# Patient Record
Sex: Male | Born: 1945 | Race: Black or African American | Hispanic: No | Marital: Single | Smoking: Current every day smoker
Health system: Southern US, Community
[De-identification: ages and names within clinical notes are randomized; demographics above are authoritative.]

## PROBLEM LIST (undated history)

## (undated) DIAGNOSIS — F4310 Post-traumatic stress disorder, unspecified: Secondary | ICD-10-CM

---

## 2016-07-01 DIAGNOSIS — F4310 Post-traumatic stress disorder, unspecified: Secondary | ICD-10-CM | POA: Insufficient documentation

## 2016-07-01 DIAGNOSIS — F1420 Cocaine dependence, uncomplicated: Secondary | ICD-10-CM | POA: Insufficient documentation

## 2017-08-13 DIAGNOSIS — Z765 Malingerer [conscious simulation]: Secondary | ICD-10-CM | POA: Insufficient documentation

## 2018-06-28 ENCOUNTER — Emergency Department (HOSPITAL_COMMUNITY): Payer: Medicaid Other

## 2018-06-28 ENCOUNTER — Encounter (HOSPITAL_COMMUNITY): Payer: Self-pay | Admitting: Emergency Medicine

## 2018-06-28 ENCOUNTER — Emergency Department (HOSPITAL_COMMUNITY)
Admission: EM | Admit: 2018-06-28 | Discharge: 2018-06-28 | Disposition: A | Discharge status: Home / Self Care | Payer: Medicaid Other | Attending: Emergency Medicine | Admitting: Emergency Medicine

## 2018-06-28 DIAGNOSIS — Y999 Unspecified external cause status: Secondary | ICD-10-CM | POA: Insufficient documentation

## 2018-06-28 DIAGNOSIS — J449 Chronic obstructive pulmonary disease, unspecified: Secondary | ICD-10-CM | POA: Insufficient documentation

## 2018-06-28 DIAGNOSIS — Y929 Unspecified place or not applicable: Secondary | ICD-10-CM | POA: Diagnosis not present

## 2018-06-28 DIAGNOSIS — W010XXA Fall on same level from slipping, tripping and stumbling without subsequent striking against object, initial encounter: Secondary | ICD-10-CM | POA: Diagnosis not present

## 2018-06-28 DIAGNOSIS — F17200 Nicotine dependence, unspecified, uncomplicated: Secondary | ICD-10-CM | POA: Insufficient documentation

## 2018-06-28 DIAGNOSIS — S99911A Unspecified injury of right ankle, initial encounter: Secondary | ICD-10-CM | POA: Diagnosis present

## 2018-06-28 DIAGNOSIS — Y939 Activity, unspecified: Secondary | ICD-10-CM | POA: Diagnosis not present

## 2018-06-28 DIAGNOSIS — S82851A Displaced trimalleolar fracture of right lower leg, initial encounter for closed fracture: Secondary | ICD-10-CM | POA: Diagnosis not present

## 2018-06-28 HISTORY — DX: Post-traumatic stress disorder, unspecified: F43.10

## 2018-06-28 LAB — CBC WITH DIFFERENTIAL/PLATELET
Abs Immature Granulocytes: 0 10*3/uL (ref 0.0–0.1)
BASOS ABS: 0 10*3/uL (ref 0.0–0.1)
Basophils Relative: 0 %
EOS PCT: 1 %
Eosinophils Absolute: 0.1 10*3/uL (ref 0.0–0.7)
HEMATOCRIT: 43.1 % (ref 39.0–52.0)
Hemoglobin: 13.7 g/dL (ref 13.0–17.0)
Immature Granulocytes: 0 %
Lymphocytes Relative: 26 %
Lymphs Abs: 2.3 10*3/uL (ref 0.7–4.0)
MCH: 27.3 pg (ref 26.0–34.0)
MCHC: 31.8 g/dL (ref 30.0–36.0)
MCV: 85.9 fL (ref 78.0–100.0)
MONO ABS: 0.7 10*3/uL (ref 0.1–1.0)
Monocytes Relative: 7 %
Neutro Abs: 5.9 10*3/uL (ref 1.7–7.7)
Neutrophils Relative %: 66 %
Platelets: 234 10*3/uL (ref 150–400)
RBC: 5.02 MIL/uL (ref 4.22–5.81)
RDW: 16.7 % — ABNORMAL HIGH (ref 11.5–15.5)
WBC: 9 10*3/uL (ref 4.0–10.5)

## 2018-06-28 LAB — BASIC METABOLIC PANEL
ANION GAP: 12 (ref 5–15)
BUN: 14 mg/dL (ref 8–23)
CHLORIDE: 108 mmol/L (ref 98–111)
CO2: 20 mmol/L — ABNORMAL LOW (ref 22–32)
Calcium: 9.4 mg/dL (ref 8.9–10.3)
Creatinine, Ser: 1.01 mg/dL (ref 0.61–1.24)
Glucose, Bld: 107 mg/dL — ABNORMAL HIGH (ref 70–99)
POTASSIUM: 4.3 mmol/L (ref 3.5–5.1)
SODIUM: 140 mmol/L (ref 135–145)

## 2018-06-28 MED ORDER — OXYCODONE-ACETAMINOPHEN 5-325 MG PO TABS: 1.0000 | ORAL_TABLET | Freq: Once | ORAL | Status: DC

## 2018-06-28 MED ORDER — OXYCODONE-ACETAMINOPHEN 5-325 MG PO TABS: 1.0000 | ORAL_TABLET | ORAL | 0 refills | Status: DC | PRN

## 2018-06-28 MED ORDER — OXYCODONE-ACETAMINOPHEN 5-325 MG PO TABS
1.0000 | ORAL_TABLET | Freq: Once | ORAL | Status: AC
  Administered 2018-06-28: 1 via ORAL
  Filled 2018-06-28: qty 1

## 2018-06-28 MED ORDER — FENTANYL CITRATE (PF) 100 MCG/2ML IJ SOLN
50.0000 ug | Freq: Once | INTRAMUSCULAR | Status: AC
  Administered 2018-06-28: 50 ug via INTRAVENOUS
  Filled 2018-06-28: qty 2

## 2018-06-28 NOTE — ED Provider Notes (Signed)
[name] Provider Note   CSN: [id number] Arrival date & time: 06/28/18  0005   History   Chief Complaint Chief Complaint  Patient presents with  . Ankle Injury    HPI Christopher Short is a 72 y.o. male who presents right ankle injury. PMH significant for PTSD, COPD, hx of cocaine use. He states that he stepped in a hole yesterday and injured his ankle and fell. He states he fell hard but denies any other injuries. The pain is severe, constant, throbbing. Nothing makes it better. He has been walking on it. He reports just being [facility] for [facility]. He lives alone and ambulates with a cane.  HPI  Past Medical History:  Diagnosis Date  . PTSD (post-traumatic stress disorder)     There are no active problems to display for this patient.   History reviewed. No pertinent surgical history.      Home Medications    Prior to Admission medications   Not on File    Family History No family history on file.  Social History Social History   Tobacco Use  . Smoking status: Current Every Day Smoker  . Smokeless tobacco: Never Used  Substance Use Topics  . Alcohol use: Never    Frequency: Never  . Drug use: Never     Allergies   Patient has no known allergies.   Review of Systems Review of Systems  Musculoskeletal: Positive for arthralgias and joint swelling.  Skin: Negative for wound.     Physical Exam Updated Vital Signs BP (!) 127/101 (BP Location: Right Arm)   Pulse (!) 104   Temp 98.5 F (36.9 C) (Oral)   Resp 18   Ht 6\' 4"  (1.93 m)   Wt 106.6 kg (235 lb)   SpO2 96%   BMI 28.61 kg/m   Physical Exam  Constitutional: He is oriented to person, place, and time. He appears well-developed and well-nourished. No distress.  HENT:  Head: Normocephalic and atraumatic.  Eyes: Pupils are equal, round, and reactive to light. Conjunctivae are normal. Right eye exhibits no discharge. Left eye exhibits no  discharge. No scleral icterus.  Neck: Normal range of motion.  Cardiovascular: Normal rate.  Pulmonary/Chest: Effort normal. No respiratory distress.  Abdominal: He exhibits no distension.  Musculoskeletal:  Right ankle: Diffuse ankle and foot swelling. No open wounds. Diffuse tenderness to palpation. N/V intact.   Neurological: He is alert and oriented to person, place, and time.  Skin: Skin is warm and dry.  Psychiatric: He has a normal mood and affect. His behavior is normal.  Nursing note and vitals reviewed.    ED Treatments / Results  Labs (all labs ordered are listed, but only abnormal results are displayed) Labs Reviewed  BASIC METABOLIC PANEL - Abnormal; Notable for the following components:      Result Value   CO2 20 (*)    Glucose, Bld 107 (*)    All other components within normal limits  CBC WITH DIFFERENTIAL/PLATELET - Abnormal; Notable for the following components:   RDW 16.7 (*)    All other components within normal limits    EKG None  Radiology Dg Ankle Complete Right  Result Date: 06/28/2018 CLINICAL DATA:  Right ankle pain and swelling after injury. Stepped in a hole this evening. EXAM: RIGHT ANKLE - COMPLETE 3+ VIEW COMPARISON:  None. FINDINGS: Displaced trimalleolar fracture. Oblique fracture of the distal fibula just proximal to the ankle mortise is mildly displaced.  Transverse medial malleolar fracture is displaced. Distal tibial and medial malleolar fracture fragments remain aligned with the talus with mild talar tilt and posterior subluxation of the talar dome with respect to the tibial plafond. Small posterior tibial tubercle fracture is displaced 4 mm, with only a small displaced fragment. Diffuse soft tissue edema about the ankle, possible scattered soft tissue air. IMPRESSION: 1. Displaced trimalleolar fracture with mild talar tilt and subluxation. 2. Diffuse soft tissue edema. Probable scattered soft tissue air, raising concern for open fracture or  associated laceration. Electronically Signed   By: Rubye Oaks M.D.   On: 06/28/2018 02:08   Dg Knee Right Port  Result Date: 06/28/2018 CLINICAL DATA:  Right knee pain. EXAM: PORTABLE RIGHT KNEE - 1-2 VIEW COMPARISON:  None. FINDINGS: No fracture or dislocation. Quadriceps tendon enthesophyte. Minimal patellofemoral spurring. Small joint effusion. Mild anterior soft tissue edema. IMPRESSION: 1. Minimal osteoarthritis. No fracture or acute osseous abnormality. 2. Small joint effusion and soft tissue edema. Electronically Signed   By: Rubye Oaks M.D.   On: 06/28/2018 06:59    Procedures Procedures (including critical care time)  Medications Ordered in ED Medications  oxyCODONE-acetaminophen (PERCOCET/ROXICET) 5-325 MG per tablet 1 tablet (has no administration in time range)  fentaNYL (SUBLIMAZE) injection 50 mcg (50 mcg Intravenous Given 06/28/18 0737)     Initial Impression / Assessment and Plan / ED Course  I have reviewed the triage vital signs and the nursing notes.  Pertinent labs & imaging results that were available during my care of the patient were reviewed by me and considered in my medical decision making (see chart for details).  72 year old male presents with right ankle injury after stepping in a hole last night. Xray shows displaced trimalleolar fracture. On exam he has diffuse swelling. Skin is intact and he is N/V intact. Shared visit with Dr. Bebe Shaggy. Discussed with Dr. Jena Gauss with orthopedics who recommends splint and f/u in the office Tuesday if pain is controlled and he can mobilize. If not, will ask hospitalist to admit for possible surgery this weekend.   11:28 AM Splint was applied. Pain has been controlled. He was able to ambulate however there are concerns from orthotech regarding safety. Discussed admission with patient and he declines. He would prefer f/u on Tuesday with ortho. Will place CM consult so they can possibly get him a walker or another  assistive device that would be easier to use. The patient is agreeable to this. Will d/c.   Final Clinical Impressions(s) / ED Diagnoses   Final diagnoses:  Closed trimalleolar fracture of right ankle, initial encounter    ED Discharge Orders    None       Bethel Born, PA-C 06/28/18 1300    Zadie Rhine, MD 06/29/18 [phone]

## 2018-06-28 NOTE — Care Management Note (Signed)
Case Management Note  Patient Details  Name: Christopher Stallsndrew Isaacs Jr. MRN: [id number] Date of Birth: 1945-12-12  Subjective/Objective:      Pt presented for leg pain and fracture.  Pt eager to leave but seems to have little to no support and has been advised that staff questions his safety.  Pt is alert and oriented and insists on leaving. Pt reluctantly agrees to home health and has no preference of agency.       [facility]: [facility]  [facility].  Pt did not want to wait for a walker or W/C and elected to leave in a cab with crutches.   Expected Discharge Date:                  Expected Discharge Plan:  Home w Home Health Services  In-House Referral:  NA  Discharge planning Services  CM Consult  Post Acute Care Choice:  Durable Medical Equipment, Home Health Choice offered to:  Patient  DME Arranged:  Crutches DME Agency:  NA(arranged by Ortho services)  HH Arranged:  Nurse's Aide, PT HH Agency:  [facility]  Status of Service:  Completed, signed off  If discussed at Long Length of Stay Meetings, dates discussed:    Additional Comments:  [name]  [name], RN 06/28/2018, 12:51 PM

## 2018-06-28 NOTE — ED Notes (Signed)
Pt ambulating throughout ed with crutches.  No difficulty utilizing crutches.  States he does not want to wait for a wheelchair and walker.  States he has stairs at home.  PA aware.

## 2018-06-28 NOTE — ED Notes (Signed)
Ortho at bedside.  Ambulated pt with crutches.  Pt very unsteady on crutches.

## 2018-06-28 NOTE — ED Triage Notes (Signed)
Patient accidentally stepped on a hole this evening and injured his right ankle with pain and swelling , skin intact ,ambulatory using his cane .

## 2018-06-28 NOTE — Discharge Instructions (Addendum)
Call on Monday for an appointment on Tuesday with Dr. Jena GaussHaddix Take pain medicine as needed for severe pain Keep leg elevated to reduce swelling  Return if you are worsening

## 2018-06-28 NOTE — Progress Notes (Signed)
Orthopedic Tech Progress Note Patient Details:  Christopher Stallsndrew Puello Jr. [birth year] [id number]  Patient ID: Christopher StallsAndrew Trant Jr., male   DOB: [birth year], 72 y.o.   MRN: [id number]   Christopher Short, Christopher Short 06/28/2018, 1:22 PM Christopher Short called ortho and stated that Dr. Jefm BryantGekas wants pt to have crutches for home. Pt has receive crutch training and is able to ambulate with them.

## 2018-06-28 NOTE — ED Notes (Signed)
Ortho at bedside.

## 2018-06-28 NOTE — ED Notes (Addendum)
Ortho paged and reponded

## 2018-06-28 NOTE — Progress Notes (Signed)
Orthopedic Tech Progress Note Patient Details:  Christopher Stallsndrew Wadsworth Jr. 1946/04/11 [id number]  Patient ID: Christopher StallsAndrew Snedden Jr., male   DOB: 1946/04/11, 72 y.o.   MRN: [id number]   Nikki DomCrawford, Zhuri Krass 06/28/2018, 10:33 AM Delete one ortho tech visit

## 2018-06-28 NOTE — Progress Notes (Signed)
Orthopedic Tech Progress Note Patient Details:  Christopher Stallsndrew Fussell Jr. 10-Aug-1946 [id number] Pt unable to use crutches; RN notified Ortho Devices Type of Ortho Device: Ace wrap, Crutches, Post (short leg) splint, Stirrup splint Ortho Device/Splint Location: rle Ortho Device/Splint Interventions: Application   Post Interventions Patient Tolerated: Well Instructions Provided: Care of device   Nikki DomCrawford, Leilany Digeronimo 06/28/2018, 10:31 AM

## 2018-07-04 ENCOUNTER — Encounter (HOSPITAL_COMMUNITY): Payer: Self-pay | Admitting: *Deleted

## 2018-07-04 ENCOUNTER — Emergency Department (HOSPITAL_COMMUNITY)
Admission: EM | Admit: 2018-07-04 | Discharge: 2018-07-04 | Disposition: A | Discharge status: Home / Self Care | Payer: Medicaid Other | Attending: Emergency Medicine | Admitting: Emergency Medicine

## 2018-07-04 DIAGNOSIS — S82851D Displaced trimalleolar fracture of right lower leg, subsequent encounter for closed fracture with routine healing: Secondary | ICD-10-CM | POA: Insufficient documentation

## 2018-07-04 DIAGNOSIS — F17200 Nicotine dependence, unspecified, uncomplicated: Secondary | ICD-10-CM | POA: Diagnosis not present

## 2018-07-04 DIAGNOSIS — X509XXA Other and unspecified overexertion or strenuous movements or postures, initial encounter: Secondary | ICD-10-CM | POA: Insufficient documentation

## 2018-07-04 DIAGNOSIS — Y999 Unspecified external cause status: Secondary | ICD-10-CM | POA: Insufficient documentation

## 2018-07-04 DIAGNOSIS — S8991XA Unspecified injury of right lower leg, initial encounter: Secondary | ICD-10-CM | POA: Diagnosis present

## 2018-07-04 DIAGNOSIS — Y939 Activity, unspecified: Secondary | ICD-10-CM | POA: Diagnosis not present

## 2018-07-04 DIAGNOSIS — Y929 Unspecified place or not applicable: Secondary | ICD-10-CM | POA: Insufficient documentation

## 2018-07-04 MED ORDER — OXYCODONE-ACETAMINOPHEN 5-325 MG PO TABS: 1.0000 | ORAL_TABLET | ORAL | 0 refills | Status: DC | PRN

## 2018-07-04 MED ORDER — OXYCODONE-ACETAMINOPHEN 5-325 MG PO TABS
1.0000 | ORAL_TABLET | Freq: Once | ORAL | Status: AC
  Administered 2018-07-04: 1 via ORAL
  Filled 2018-07-04: qty 1
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
## 2018-07-04 NOTE — Progress Notes (Signed)
Orthopedic Tech Progress Note Patient Details:  Christopher Stallsndrew Schuelke Jr. 06/03/1946 [id number]  Ortho Devices Type of Ortho Device: Ace wrap, Post (short leg) splint, Stirrup splint Ortho Device/Splint Location: rle Ortho Device/Splint Interventions: Application   Post Interventions Patient Tolerated: Well Instructions Provided: Care of device   Nikki DomCrawford, Curtis Cain 07/04/2018, 2:36 PM

## 2018-07-04 NOTE — Discharge Instructions (Signed)
YOU MUST GO SEE ORTHOPEDICS SPECIALIST OR YOUR LEG WILL NOT HEAL. Keep it elevated. Ice. Do not walk on it. Call either physician provided or any other physician you choose, but you must see someone next week. Pain medications as needed.

## 2019-01-03 ENCOUNTER — Encounter (HOSPITAL_COMMUNITY): Payer: Self-pay

## 2019-01-03 ENCOUNTER — Other Ambulatory Visit: Payer: Self-pay

## 2019-01-03 ENCOUNTER — Emergency Department (HOSPITAL_COMMUNITY): Payer: Medicaid Other

## 2019-01-03 ENCOUNTER — Emergency Department (HOSPITAL_COMMUNITY)
Admission: EM | Admit: 2019-01-03 | Discharge: 2019-01-05 | Disposition: A | Discharge status: Home / Self Care | Payer: Medicaid Other | Attending: Emergency Medicine | Admitting: Emergency Medicine

## 2019-01-03 DIAGNOSIS — R45851 Suicidal ideations: Secondary | ICD-10-CM | POA: Insufficient documentation

## 2019-01-03 DIAGNOSIS — R911 Solitary pulmonary nodule: Secondary | ICD-10-CM | POA: Insufficient documentation

## 2019-01-03 DIAGNOSIS — F4310 Post-traumatic stress disorder, unspecified: Secondary | ICD-10-CM | POA: Insufficient documentation

## 2019-01-03 DIAGNOSIS — Z765 Malingerer [conscious simulation]: Secondary | ICD-10-CM | POA: Diagnosis not present

## 2019-01-03 DIAGNOSIS — F1410 Cocaine abuse, uncomplicated: Secondary | ICD-10-CM | POA: Insufficient documentation

## 2019-01-03 DIAGNOSIS — N3000 Acute cystitis without hematuria: Secondary | ICD-10-CM | POA: Diagnosis not present

## 2019-01-03 DIAGNOSIS — Z79899 Other long term (current) drug therapy: Secondary | ICD-10-CM | POA: Insufficient documentation

## 2019-01-03 DIAGNOSIS — F17210 Nicotine dependence, cigarettes, uncomplicated: Secondary | ICD-10-CM | POA: Insufficient documentation

## 2019-01-03 DIAGNOSIS — F332 Major depressive disorder, recurrent severe without psychotic features: Secondary | ICD-10-CM | POA: Diagnosis not present

## 2019-01-03 LAB — COMPREHENSIVE METABOLIC PANEL
ALT: 21 U/L (ref 0–44)
ANION GAP: 11 (ref 5–15)
AST: 24 U/L (ref 15–41)
Albumin: 3.6 g/dL (ref 3.5–5.0)
Alkaline Phosphatase: 51 U/L (ref 38–126)
BILIRUBIN TOTAL: 0.7 mg/dL (ref 0.3–1.2)
BUN: 7 mg/dL — ABNORMAL LOW (ref 8–23)
CALCIUM: 9.7 mg/dL (ref 8.9–10.3)
CO2: 25 mmol/L (ref 22–32)
Chloride: 105 mmol/L (ref 98–111)
Creatinine, Ser: 0.97 mg/dL (ref 0.61–1.24)
Glucose, Bld: 95 mg/dL (ref 70–99)
Potassium: 3.8 mmol/L (ref 3.5–5.1)
SODIUM: 141 mmol/L (ref 135–145)
TOTAL PROTEIN: 7.1 g/dL (ref 6.5–8.1)

## 2019-01-03 LAB — RAPID URINE DRUG SCREEN, HOSP PERFORMED
Amphetamines: NOT DETECTED
Barbiturates: NOT DETECTED
Benzodiazepines: NOT DETECTED
Cocaine: POSITIVE — AB
OPIATES: NOT DETECTED
Tetrahydrocannabinol: NOT DETECTED

## 2019-01-03 LAB — CBC
HCT: 45.1 % (ref 39.0–52.0)
Hemoglobin: 13.9 g/dL (ref 13.0–17.0)
MCH: 26.9 pg (ref 26.0–34.0)
MCHC: 30.8 g/dL (ref 30.0–36.0)
MCV: 87.4 fL (ref 80.0–100.0)
PLATELETS: 334 10*3/uL (ref 150–400)
RBC: 5.16 MIL/uL (ref 4.22–5.81)
RDW: 16.3 % — ABNORMAL HIGH (ref 11.5–15.5)
WBC: 6.9 10*3/uL (ref 4.0–10.5)
nRBC: 0 % (ref 0.0–0.2)

## 2019-01-03 LAB — URINALYSIS, ROUTINE W REFLEX MICROSCOPIC
Bilirubin Urine: NEGATIVE
Glucose, UA: NEGATIVE mg/dL
Ketones, ur: NEGATIVE mg/dL
Nitrite: NEGATIVE
PH: 5 (ref 5.0–8.0)
Protein, ur: NEGATIVE mg/dL
Specific Gravity, Urine: 1.019 (ref 1.005–1.030)

## 2019-01-03 LAB — SALICYLATE LEVEL

## 2019-01-03 LAB — ETHANOL: ALCOHOL ETHYL (B): 45 mg/dL — AB (ref <10)

## 2019-01-03 LAB — ACETAMINOPHEN LEVEL

## 2019-01-03 LAB — TSH: TSH: 0.571 u[IU]/mL (ref 0.350–4.500)

## 2019-01-03 MED ORDER — LORAZEPAM 2 MG/ML IJ SOLN: 0.0000 mg | Freq: Two times a day (BID) | INTRAMUSCULAR | Status: DC

## 2019-01-03 MED ORDER — NICOTINE 21 MG/24HR TD PT24
21.0000 mg | MEDICATED_PATCH | Freq: Every day | TRANSDERMAL | Status: DC
  Administered 2019-01-05: 21 mg via TRANSDERMAL
  Filled 2019-01-03: qty 1

## 2019-01-03 MED ORDER — LORAZEPAM 2 MG/ML IJ SOLN: 0.0000 mg | Freq: Four times a day (QID) | INTRAMUSCULAR | Status: DC

## 2019-01-03 MED ORDER — LORAZEPAM 1 MG PO TABS
0.0000 mg | ORAL_TABLET | Freq: Four times a day (QID) | ORAL | Status: DC
  Administered 2019-01-03: 2 mg via ORAL
  Filled 2019-01-03: qty 2

## 2019-01-03 MED ORDER — THIAMINE HCL 100 MG/ML IJ SOLN: 100.0000 mg | Freq: Every day | INTRAMUSCULAR | Status: DC

## 2019-01-03 MED ORDER — ONDANSETRON HCL 4 MG PO TABS: 4.0000 mg | ORAL_TABLET | Freq: Three times a day (TID) | ORAL | Status: DC | PRN

## 2019-01-03 MED ORDER — VITAMIN B-1 100 MG PO TABS
100.0000 mg | ORAL_TABLET | Freq: Every day | ORAL | Status: DC
  Administered 2019-01-03 – 2019-01-05 (×3): 100 mg via ORAL
  Filled 2019-01-03 (×3): qty 1

## 2019-01-03 MED ORDER — ALUM & MAG HYDROXIDE-SIMETH 200-200-20 MG/5ML PO SUSP: 30.0000 mL | Freq: Four times a day (QID) | ORAL | Status: DC | PRN

## 2019-01-03 MED ORDER — LORAZEPAM 1 MG PO TABS: 0.0000 mg | ORAL_TABLET | Freq: Two times a day (BID) | ORAL | Status: DC

## 2019-01-03 NOTE — ED Notes (Signed)
Christopher Short called from [facility]. Pt does meet inpatient criteria. Pt wants to go to [facility], but there are no beds. Pt aware and states understanding.

## 2019-01-03 NOTE — ED Notes (Signed)
Patient denies pain and is resting comfortably.  

## 2019-01-03 NOTE — ED Notes (Signed)
Pt wanded by security. 

## 2019-01-03 NOTE — ED Notes (Signed)
Called staffing for sitter 

## 2019-01-03 NOTE — ED Notes (Signed)
Given turkey sandwich and coke 
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
## 2019-01-03 NOTE — ED Triage Notes (Signed)
Pt with c/o depression and SI.  Pt states "I feel hopeless and depressed".

## 2019-01-03 NOTE — ED Provider Notes (Signed)
[name] EMERGENCY DEPARTMENT Provider Note   CSN: [id number] Arrival date & time: 01/03/19  1609     History   Chief Complaint Chief Complaint  Patient presents with  . Suicidal    HPI [facility]. is a 73 y.o. male.  Pt presents to the ED today with SI.  The pt has a hx of depression and has been hospitalized for SI (last Jan 2019).  The pt lives alone and feels hopeless.  He has no specific plan.     Past Medical History:  Diagnosis Date  . PTSD (post-traumatic stress disorder)     Patient Active Problem List   Diagnosis Date Noted  . Tobacco use disorder 01/06/2019  . Alcohol use disorder, moderate, dependence (HCC) 01/06/2019  . Severe recurrent major depressive disorder with psychotic features (HCC) 01/05/2019  . Malingering 08/13/2017  . Cocaine use disorder, moderate, dependence (HCC) 07/01/2016  . PTSD (post-traumatic stress disorder) 07/01/2016    History reviewed. No pertinent surgical history.      Home Medications    Prior to Admission medications   Medication Sig Start Date End Date Taking? Authorizing Provider  albuterol (PROVENTIL HFA;VENTOLIN HFA) 108 (90 Base) MCG/ACT inhaler Inhale 2 puffs into the lungs every 6 (six) hours as needed for wheezing or shortness of breath.   Yes [provider]  Cholecalciferol (VITAMIN D) 50 MCG (2000 UT) tablet Take 2,000 Units by mouth daily.   Yes [provider]  Melatonin 3 MG TABS Take 6 mg by mouth at bedtime.   Yes [provider]  Multiple Vitamin (MULTIVITAMIN WITH MINERALS) TABS tablet Take 1 tablet by mouth daily.   Yes [provider]  prazosin (MINIPRESS) 1 MG capsule Take 1 mg by mouth at bedtime.   Yes [provider]  QUEtiapine (SEROQUEL) 300 MG tablet Take 600 mg by mouth at bedtime.   Yes [provider]  Skin Protectants, Misc. (EUCERIN) cream Apply 1 application topically daily as needed for dry skin (apply to  feet).   Yes [provider]  talc powder Apply 1 application topically daily as needed (itching/discomfort on feet).   Yes [provider]  tiotropium (SPIRIVA) 18 MCG inhalation capsule Place 18 mcg into inhaler and inhale daily.   Yes [provider]  traZODone (DESYREL) 150 MG tablet Take 300 mg by mouth at bedtime.   Yes [provider]  cephALEXin (KEFLEX) 500 MG capsule Take 2 capsules (1,000 mg total) by mouth 2 (two) times daily. 01/05/19   Arby Barrette, MD  oxyCODONE-acetaminophen (PERCOCET) 5-325 MG tablet Take 1 tablet by mouth every 4 (four) hours as needed for severe pain. Patient not taking: Reported on 01/03/2019 07/04/18   Jaynie Crumble, PA-C    Family History History reviewed. No pertinent family history.  Social History Social History   Tobacco Use  . Smoking status: Current Every Day Smoker    Packs/day: 1.00    Types: Cigarettes  . Smokeless tobacco: Never Used  Substance Use Topics  . Alcohol use: Yes    Frequency: Never    Comment: last drank 01-03-19 16 ox x 1 beer   . Drug use: Never     Allergies   Patient has no known allergies.   Review of Systems Review of Systems  Psychiatric/Behavioral: Positive for suicidal ideas.  All other systems reviewed and are negative.    Physical Exam Updated Vital Signs BP 118/72   Pulse 90   Temp 98.5 F (  36.9 C) (Oral)   Resp 18   SpO2 95%   Physical Exam Vitals signs and nursing note reviewed.  Constitutional:      Appearance: Normal appearance.  HENT:     Head: Normocephalic and atraumatic.     Right Ear: External ear normal.     Left Ear: External ear normal.     Nose: Nose normal.     Mouth/Throat:     Mouth: Mucous membranes are moist.  Eyes:     Extraocular Movements: Extraocular movements intact.     Conjunctiva/sclera: Conjunctivae normal.     Pupils: Pupils are equal, round, and reactive to light.  Neck:     Musculoskeletal: Normal range of motion  and neck supple.  Cardiovascular:     Rate and Rhythm: Normal rate and regular rhythm.     Pulses: Normal pulses.     Heart sounds: Normal heart sounds.  Pulmonary:     Effort: Pulmonary effort is normal.     Breath sounds: Normal breath sounds.  Abdominal:     General: Abdomen is flat.  Musculoskeletal: Normal range of motion.  Skin:    General: Skin is warm.     Capillary Refill: Capillary refill takes less than 2 seconds.  Neurological:     General: No focal deficit present.     Mental Status: He is alert and oriented to person, place, and time.  Psychiatric:        Thought Content: Thought content includes suicidal ideation.      ED Treatments / Results  Labs (all labs ordered are listed, but only abnormal results are displayed) Labs Reviewed  URINE CULTURE - Abnormal; Notable for the following components:      Result Value   Culture 50,000 COLONIES/mL VIRIDANS STREPTOCOCCUS (*)    All other components within normal limits  COMPREHENSIVE METABOLIC PANEL - Abnormal; Notable for the following components:   BUN 7 (*)    All other components within normal limits  ETHANOL - Abnormal; Notable for the following components:   Alcohol, Ethyl (B) 45 (*)    All other components within normal limits  ACETAMINOPHEN LEVEL - Abnormal; Notable for the following components:   Acetaminophen (Tylenol), Serum <10 (*)    All other components within normal limits  CBC - Abnormal; Notable for the following components:   RDW 16.3 (*)    All other components within normal limits  RAPID URINE DRUG SCREEN, HOSP PERFORMED - Abnormal; Notable for the following components:   Cocaine POSITIVE (*)    All other components within normal limits  URINALYSIS, ROUTINE W REFLEX MICROSCOPIC - Abnormal; Notable for the following components:   Hgb urine dipstick MODERATE (*)    Leukocytes, UA MODERATE (*)    Bacteria, UA RARE (*)    All other components within normal limits  HEMOGLOBIN A1C - Abnormal;  Notable for the following components:   Hgb A1c MFr Bld 6.0 (*)    All other components within normal limits  LIPID PANEL - Abnormal; Notable for the following components:   Triglycerides 333 (*)    VLDL 67 (*)    All other components within normal limits  SALICYLATE LEVEL  TSH    EKG EKG Interpretation  Date/Time:  Saturday January 03 2019 18:07:35 EST Ventricular Rate:  89 PR Interval:  146 QRS Duration: 84 QT Interval:  362 QTC Calculation: 440 R Axis:   48 Text Interpretation:  Sinus rhythm with Premature atrial complexes Nonspecific T wave abnormality Abnormal  ECG No old tracing to compare Confirmed by Jacalyn LefevreHaviland, Mardy Lucier [phone]([id number]) on 01/03/2019 6:11:42 PM   Radiology No results found.  Procedures Procedures (including critical care time)  Medications Ordered in ED Medications  cephALEXin (KEFLEX) capsule 1,000 mg (1,000 mg Oral Given 01/05/19 1400)     Initial Impression / Assessment and Plan / ED Course  I have reviewed the triage vital signs and the nursing notes.  Pertinent labs & imaging results that were available during my care of the patient were reviewed by me and considered in my medical decision making (see chart for details).   CT chest will be ordered while he's here for abn CXR.  Pt is medically clear for TTS eval.  His alcohol was +, so I put him on CIWA protocol.  He is not on any routine home meds.   Final Clinical Impressions(s) / ED Diagnoses   Final diagnoses:  Suicidal ideation  Malingering  Cocaine abuse (HCC)  PTSD (post-traumatic stress disorder)  Acute cystitis without hematuria    ED Discharge Orders         Ordered    cephALEXin (KEFLEX) 500 MG capsule  2 times daily     01/05/19 1403           Jacalyn LefevreHaviland, Jadyn Brasher, MD 01/07/19 [phone]

## 2019-01-04 NOTE — ED Notes (Signed)
Breakfast tray ordered 

## 2019-01-04 NOTE — ED Notes (Addendum)
Christopher Short EMT knocked on bathroom door to check on Pt to make sure he was ok and didn't need anything. Pt came out of bathroom yelling at EMT saying " I am a grown ass 73 year old man I don't need you checking up on me" Christopher Short told Pt "I'm just doing my job sir and was just checking on you" Pt stated "You can check on someone else you don't need to be checking on me and you can get away from me" Pt then went into room and saw that his bed had been remade and that his room and food had been cleaned up and started yelling at Christopher Short saying "I told him not to touch my food or my stuff. I'll bitch slap your ass" This tech got up and redirected pt back into his room and informed him that this tech had changed his bed sheets and picked up his food and gotten rid of his trash. This tech apologized to Pt and told him I just wanted him to have clean sheets and a clean room since everything else was clean. Pt expressed understanding and said thank you. Candy RN in room talking to pt at this time. Pt calm and cooperative at this time

## 2019-01-04 NOTE — ED Notes (Signed)
Patient denies pain and is resting comfortably.  

## 2019-01-04 NOTE — ED Notes (Signed)
Pt sleeping. 

## 2019-01-04 NOTE — [facility] Counselor (Signed)
Pt was reassessed.  He stated that he continues to feel suicidal, and if discharged, he would act on his suicidal ideation.  Recommend continued inpatient.  From assessment:  73 y.o. divorced male who presents unaccompanied to [name] reporting symptoms of depression, anxiety and suicidal ideation. Pt reports he has a history of PTSD related to [employer] and receives medication management through [facility] in [place]. Pt says he has felt "extremely depressed" for the past three days and scales his depressive symptoms at 9/10. Pt acknowledges symptoms including social withdrawal, loss of interest in usual pleasures, fatigue, irritability, decreased concentration, decreased sleep, decreased appetite and feelings of guilt, worthlessness and hopelessness. He reports current suicidal ideation with plan to jump from a bridge or step in front of a train. Pt reports one previous suicide attempt approximately 15 years ago by overdosing on medication. Pt reports he is experiencing panic attacks and flashbacks averaging 3-4 times per week. He says he has episodes of hearing voices that he cannot understand and visual disturbances, stating these symptoms have been ongoing for years

## 2019-01-04 NOTE — ED Notes (Signed)
Pt given snack and drink 

## 2019-01-04 NOTE — ED Notes (Signed)
Eating snacks given. 

## 2019-01-04 NOTE — [facility] Notes (Addendum)
Patient meets criteria for inpatient treatment. No appropriate or available beds at [facility]. CSW faxed referral to the following facility for review:  [facility]  All other [place] [facility] reported that they are on psych diversion.  TTS will continue to seek bed placement.  Vilma Meckel. Algis Greenhouse, MSW, LCSW Clinical Social Work/Disposition Phone: [phone] Fax: [phone]

## 2019-01-04 NOTE — ED Provider Notes (Signed)
Patient is medically cleared for psychiatric disposition at this time.  It is 1241 p.m. on January 04, 2019   Eber Hong, MD 01/04/19 [phone]

## 2019-01-04 NOTE — ED Notes (Signed)
Pt taking a shower 

## 2019-01-04 NOTE — ED Provider Notes (Signed)
Chart and vitals reviewed.  Patient meets inpatient criteria, pending bed placement.   Alveria ApleyCaccavale, Miryam Mcelhinney, PA-C 01/04/19 1214    Gwyneth SproutPlunkett, Whitney, MD 01/04/19 [phone]

## 2019-01-05 ENCOUNTER — Other Ambulatory Visit: Payer: Self-pay

## 2019-01-05 ENCOUNTER — Inpatient Hospital Stay
Admission: AD | Admit: 2019-01-05 | Discharge: 2019-01-07 | DRG: 885 | Disposition: A | Discharge status: Other Acute Inpatient Hospital | Payer: Medicare Other | Source: Intra-hospital | Attending: Psychiatry | Admitting: Psychiatry

## 2019-01-05 DIAGNOSIS — Z638 Other specified problems related to primary support group: Secondary | ICD-10-CM

## 2019-01-05 DIAGNOSIS — F4310 Post-traumatic stress disorder, unspecified: Secondary | ICD-10-CM | POA: Diagnosis present

## 2019-01-05 DIAGNOSIS — N390 Urinary tract infection, site not specified: Secondary | ICD-10-CM | POA: Diagnosis present

## 2019-01-05 DIAGNOSIS — F333 Major depressive disorder, recurrent, severe with psychotic symptoms: Secondary | ICD-10-CM | POA: Diagnosis present

## 2019-01-05 DIAGNOSIS — F1420 Cocaine dependence, uncomplicated: Secondary | ICD-10-CM | POA: Diagnosis present

## 2019-01-05 DIAGNOSIS — R45851 Suicidal ideations: Secondary | ICD-10-CM | POA: Diagnosis present

## 2019-01-05 DIAGNOSIS — Z7951 Long term (current) use of inhaled steroids: Secondary | ICD-10-CM | POA: Diagnosis not present

## 2019-01-05 DIAGNOSIS — F1020 Alcohol dependence, uncomplicated: Secondary | ICD-10-CM | POA: Diagnosis present

## 2019-01-05 DIAGNOSIS — F4312 Post-traumatic stress disorder, chronic: Secondary | ICD-10-CM | POA: Diagnosis present

## 2019-01-05 DIAGNOSIS — F332 Major depressive disorder, recurrent severe without psychotic features: Secondary | ICD-10-CM | POA: Diagnosis not present

## 2019-01-05 DIAGNOSIS — J449 Chronic obstructive pulmonary disease, unspecified: Secondary | ICD-10-CM | POA: Diagnosis present

## 2019-01-05 DIAGNOSIS — F17200 Nicotine dependence, unspecified, uncomplicated: Secondary | ICD-10-CM | POA: Diagnosis present

## 2019-01-05 DIAGNOSIS — F17210 Nicotine dependence, cigarettes, uncomplicated: Secondary | ICD-10-CM | POA: Diagnosis present

## 2019-01-05 DIAGNOSIS — Z79899 Other long term (current) drug therapy: Secondary | ICD-10-CM | POA: Diagnosis not present

## 2019-01-05 LAB — LIPID PANEL
Cholesterol: 176 mg/dL (ref 0–200)
HDL: 44 mg/dL (ref >40)
LDL Cholesterol: 65 mg/dL (ref 0–99)
Total CHOL/HDL Ratio: 4 RATIO
Triglycerides: 333 mg/dL — ABNORMAL HIGH (ref <150)
VLDL: 67 mg/dL — ABNORMAL HIGH (ref 0–40)

## 2019-01-05 LAB — HEMOGLOBIN A1C
Hgb A1c MFr Bld: 6 % — ABNORMAL HIGH (ref 4.8–5.6)
Mean Plasma Glucose: 125.5 mg/dL

## 2019-01-05 MED ORDER — CEPHALEXIN 500 MG PO CAPS: 1000.0000 mg | ORAL_CAPSULE | Freq: Two times a day (BID) | ORAL | 0 refills | Status: DC

## 2019-01-05 MED ORDER — OLANZAPINE 5 MG PO TBDP: 10.0000 mg | ORAL_TABLET | Freq: Three times a day (TID) | ORAL | Status: DC | PRN

## 2019-01-05 MED ORDER — CEPHALEXIN 250 MG PO CAPS
1000.0000 mg | ORAL_CAPSULE | Freq: Once | ORAL | Status: AC
  Administered 2019-01-05: 1000 mg via ORAL
  Filled 2019-01-05: qty 4

## 2019-01-05 MED ORDER — PRAZOSIN HCL 1 MG PO CAPS
1.0000 mg | ORAL_CAPSULE | Freq: Every day | ORAL | Status: DC
  Administered 2019-01-05 – 2019-01-06 (×2): 1 mg via ORAL
  Filled 2019-01-05 (×2): qty 1

## 2019-01-05 MED ORDER — ALUM & MAG HYDROXIDE-SIMETH 200-200-20 MG/5ML PO SUSP: 30.0000 mL | Freq: Four times a day (QID) | ORAL | Status: DC | PRN

## 2019-01-05 MED ORDER — VITAMIN B-1 100 MG PO TABS
100.0000 mg | ORAL_TABLET | Freq: Every day | ORAL | Status: DC
  Administered 2019-01-06 – 2019-01-07 (×2): 100 mg via ORAL
  Filled 2019-01-05 (×2): qty 1

## 2019-01-05 MED ORDER — TRAZODONE HCL 100 MG PO TABS
300.0000 mg | ORAL_TABLET | Freq: Every day | ORAL | Status: DC
  Administered 2019-01-05 – 2019-01-06 (×2): 300 mg via ORAL
  Filled 2019-01-05 (×2): qty 3

## 2019-01-05 MED ORDER — GABAPENTIN 300 MG PO CAPS
300.0000 mg | ORAL_CAPSULE | Freq: Three times a day (TID) | ORAL | Status: DC
  Administered 2019-01-05 – 2019-01-07 (×6): 300 mg via ORAL
  Filled 2019-01-05 (×6): qty 1

## 2019-01-05 MED ORDER — GABAPENTIN 300 MG PO CAPS
300.0000 mg | ORAL_CAPSULE | Freq: Three times a day (TID) | ORAL | Status: DC
  Administered 2019-01-05: 300 mg via ORAL
  Filled 2019-01-05: qty 1

## 2019-01-05 MED ORDER — ONDANSETRON HCL 4 MG PO TABS
4.0000 mg | ORAL_TABLET | Freq: Three times a day (TID) | ORAL | Status: DC | PRN
  Filled 2019-01-05: qty 1

## 2019-01-05 MED ORDER — ACETAMINOPHEN 325 MG PO TABS: 650.0000 mg | ORAL_TABLET | Freq: Four times a day (QID) | ORAL | Status: DC | PRN

## 2019-01-05 MED ORDER — LORAZEPAM 2 MG/ML IJ SOLN: 0.0000 mg | Freq: Four times a day (QID) | INTRAMUSCULAR | Status: AC

## 2019-01-05 MED ORDER — QUETIAPINE FUMARATE 200 MG PO TABS
600.0000 mg | ORAL_TABLET | Freq: Every day | ORAL | Status: DC
  Administered 2019-01-05 – 2019-01-06 (×2): 600 mg via ORAL
  Filled 2019-01-05 (×2): qty 3

## 2019-01-05 MED ORDER — ALBUTEROL SULFATE (2.5 MG/3ML) 0.083% IN NEBU: 2.5000 mg | INHALATION_SOLUTION | Freq: Four times a day (QID) | RESPIRATORY_TRACT | Status: DC | PRN

## 2019-01-05 MED ORDER — ZIPRASIDONE MESYLATE 20 MG IM SOLR: 20.0000 mg | INTRAMUSCULAR | Status: DC | PRN

## 2019-01-05 MED ORDER — NICOTINE 21 MG/24HR TD PT24
21.0000 mg | MEDICATED_PATCH | Freq: Every day | TRANSDERMAL | Status: DC
  Administered 2019-01-06 – 2019-01-07 (×2): 21 mg via TRANSDERMAL
  Filled 2019-01-05 (×2): qty 1

## 2019-01-05 MED ORDER — TIOTROPIUM BROMIDE MONOHYDRATE 18 MCG IN CAPS
18.0000 ug | ORAL_CAPSULE | Freq: Every day | RESPIRATORY_TRACT | Status: DC
  Administered 2019-01-06 – 2019-01-07 (×2): 18 ug via RESPIRATORY_TRACT
  Filled 2019-01-05 (×2): qty 5

## 2019-01-05 MED ORDER — ALUM & MAG HYDROXIDE-SIMETH 200-200-20 MG/5ML PO SUSP: 30.0000 mL | ORAL | Status: DC | PRN

## 2019-01-05 MED ORDER — HYDROCERIN EX CREA
1.0000 "application " | TOPICAL_CREAM | Freq: Every day | CUTANEOUS | Status: DC | PRN
  Filled 2019-01-05: qty 113

## 2019-01-05 MED ORDER — LORAZEPAM 1 MG PO TABS
1.0000 mg | ORAL_TABLET | ORAL | Status: AC | PRN
  Administered 2019-01-05: 1 mg via ORAL
  Filled 2019-01-05: qty 1

## 2019-01-05 MED ORDER — VITAMIN D 25 MCG (1000 UNIT) PO TABS
2000.0000 [IU] | ORAL_TABLET | Freq: Every day | ORAL | Status: DC
  Administered 2019-01-05 – 2019-01-07 (×3): 2000 [IU] via ORAL
  Filled 2019-01-05 (×3): qty 2

## 2019-01-05 MED ORDER — THIAMINE HCL 100 MG/ML IJ SOLN: 100.0000 mg | Freq: Every day | INTRAMUSCULAR | Status: DC

## 2019-01-05 MED ORDER — ADULT MULTIVITAMIN W/MINERALS CH
1.0000 | ORAL_TABLET | Freq: Every day | ORAL | Status: DC
  Administered 2019-01-05 – 2019-01-07 (×3): 1 via ORAL
  Filled 2019-01-05 (×3): qty 1

## 2019-01-05 MED ORDER — MELATONIN 5 MG PO TABS
5.0000 mg | ORAL_TABLET | Freq: Every day | ORAL | Status: DC
  Administered 2019-01-05 – 2019-01-06 (×2): 5 mg via ORAL
  Filled 2019-01-05 (×3): qty 1

## 2019-01-05 MED ORDER — MAGNESIUM HYDROXIDE 400 MG/5ML PO SUSP: 30.0000 mL | Freq: Every day | ORAL | Status: DC | PRN

## 2019-01-05 MED ORDER — LORAZEPAM 2 MG PO TABS
0.0000 mg | ORAL_TABLET | Freq: Four times a day (QID) | ORAL | Status: AC
  Administered 2019-01-05: 1 mg via ORAL
  Filled 2019-01-05: qty 1

## 2019-01-05 MED ORDER — TALC EX POWD
1.0000 "application " | Freq: Every day | CUTANEOUS | Status: DC | PRN
  Filled 2019-01-05: qty 71

## 2019-01-05 NOTE — ED Triage Notes (Signed)
PT HAS SIGNED THE VOLUNTARY ADMISSION AND CONSENT FOR TREATMENT. tHIS FORM FAXED TO [facility]hh [phone].  ORIGINAL FORM SENT WITH pt TO [facility]. PELHAM DRIVER GIVEN ALL PAPER WORK

## 2019-01-05 NOTE — ED Provider Notes (Signed)
Patient assessed for transfer to [facility] for psychiatric care.  Stable for transfer.  Patient reports he has been very depressed and suicidal.  He reports this is been going on for quite a while.  He continues to feel this way.  Patient denies problems with pain or feeling generally ill.  Notably he has urinalysis was positive.  Patient denies he is having pain burning, abdominal pain or low back pain.  He reports about 8 months ago he was told at the [place] that he had something in his urine that needed further evaluation but that was never done.  He reports he has not been on antibiotics in the past 8 months.  Patient is alert and nontoxic.  He is sitting at the edge of the bed.  His mental status is clear.  Speech is clear.  No signs of confusion or psychosis.  Normocephalic atraumatic.  Heart is regular no rub murmur gallop.  Lungs are clear no wheeze rhonchi rales.  Abdomen is soft and nontender without guarding.  No flank tenderness to percussion.  No peripheral edema or calf tenderness.  Skin is warm and dry.  Movements are coordinated purposeful symmetric.  Patient is ambulatory without difficulty.  Will initiate Keflex based on urinalysis results.  Will add urine culture which patient provides specimen at this time.  Recommend patient be continued on twice daily Keflex for 7 days or until culture results returned.  Patient shows no signs of bacteremia or systemic symptoms.  Medically clear for psychiatric management.    Arby Barrette, MD 01/05/19 1401
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
## 2019-01-05 NOTE — ED Triage Notes (Signed)
PT INFORMED  AND  TTS MACHINE IN PT ROOM.

## 2019-01-05 NOTE — ED Triage Notes (Signed)
Pt AT DESK DEMANDING TO TALK TO THE PERSON ON THE COMPUTER SCREEN.  CALL TO [facility]  AND CHRIS AGREED TO CALL ON TTS SCREEN

## 2019-01-05 NOTE — Tx Team (Signed)
Initial Treatment Plan 01/05/2019 4:36 PM [name] RFF:[id number]    PATIENT STRESSORS: Health problems Medication change or noncompliance Substance abuse   PATIENT STRENGTHS: Ability for insight Average or above average intelligence Capable of independent living Communication skills Supportive family/friends   PATIENT IDENTIFIED PROBLEMS: Suicidal 01/05/2019  Depression  01/05/2019  Substance Abuse  01/05/2019                 DISCHARGE CRITERIA:  Ability to meet basic life and health needs Improved stabilization in mood, thinking, and/or behavior  PRELIMINARY DISCHARGE PLAN: Outpatient therapy Return to previous living arrangement  PATIENT/FAMILY INVOLVEMENT: This treatment plan has been presented to and reviewed with the patient, Christopher Short., and/or family member,  .  The patient and family have been given the opportunity to ask questions and make suggestions.  Crist Infante, RN 01/05/2019, 4:36 PM

## 2019-01-05 NOTE — ED Triage Notes (Signed)
TTS  Removed from room at request of PT. Thayer Ohm called to confirm Pt did not want to talk .

## 2019-01-05 NOTE — Progress Notes (Signed)
Admission Note:  D: Pt appeared depressed  With  a flat affect.  Pt  Voice of suicidal ideations at this time. Patient minimizing  Usage  Of cocaine  And alcohol . Reports  COPD  . Reports drug of choice is cocaine. Patient upset that he  Was not able to  Go to  [place].  Pt is redirectable and cooperative with assessment.      A: Pt admitted to unit per protocol, skin assessment and search done and no contraband found.  Pt  educated on therapeutic milieu rules. Pt was introduced to milieu by nursing staff.    R: Pt was receptive to education about the milieu .  15 min safety checks started. Clinical research associate offered support

## 2019-01-05 NOTE — ED Triage Notes (Signed)
PT at staff desk calling [facility] crisis hot line.  Pt made multiple request for this writer to talk to Management consultant. The [facility] operator informed [facility] is not being [facility] but waiting for bed at [facility] in [place] . PT Reported to [facility] crisis Center  He would kill him self if DC home today. Molli Knock attempted to talk with Pt be pt but unable to .

## 2019-01-05 NOTE — [facility] Group Notes (Signed)
[facility] Group Notes:  (Nursing/MHT/Case Management/Adjunct)  Date:  01/05/2019  Time:  3:04 PM  Type of Therapy:  Psychoeducational Skills  Participation Level:  Did Not Attend    Christopher Short 01/05/2019, 3:04 PM

## 2019-01-05 NOTE — ED Notes (Signed)
[facility] tgreatment done before transfer.

## 2019-01-05 NOTE — Progress Notes (Addendum)
Pt accepted to [facility] per Gardiner Coins TTS Counselor   Dr, Viviano Simas is the accepting provider.   Dr. Jennet Maduro is the attending provider Call report to [phone] [facility] ED notified.   Pt is Voluntary.  Pt may be transported by [facility]  Pt scheduled to arrive at [facility] @ or after, 2 PM.  Carney Bern T. Kaylyn Lim, MSW, LCSWA Disposition Clinical Social Work [phone] (cell) [phone] (office)
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
## 2019-01-05 NOTE — Plan of Care (Signed)
  Problem: Education: Goal: Knowledge of [facility] Education information/materials will improve Note:  Education on [facility]  health  information , encourage  hand washing

## 2019-01-05 NOTE — ED Notes (Signed)
Pt sleeping. 

## 2019-01-05 NOTE — BH Assessment (Signed)
Patient has been accepted to [facility].  Accepting physician is Dr. Viviano Simas.  Attending Physician will be Dr. Jennet Maduro.  Patient has been assigned to room 324-A, by [facility] Charge Nurse Mooreton F.  Call report to [phone].  Representative/Transfer Coordinator is Suhani Stillion Patient pre-admitted by [facility] Patient Access Donella Stade.)  [facility] Staff Carney Bern, TTS) made aware of acceptance.  Bed anytime after 2pm.

## 2019-01-05 NOTE — ED Notes (Signed)
Breakfast tray ordered 

## 2019-01-05 NOTE — Plan of Care (Signed)
Newly admitted. Sad and depressed. Anxious and restless. CIWA 10. Mostly in bed, out for snack. Staff continue to monitor.

## 2019-01-05 NOTE — Progress Notes (Signed)
Patient ID: Christopher Bolich., male   DOB: 07/28/1946, 72 y.o.   MRN: [id number] Per State regulations 482.30 this chart was reviewed for medical necessity with respect to the patient's admission/duration of stay.    Next review date: 01/09/2019  Thurman Coyer, BSN, RN-BC  Case Manager

## 2019-01-05 NOTE — Progress Notes (Signed)
[facility]  They are all on wait lists, or diversion as follows:  [place]-Diversion [place]-Wait List- 2 patients ahead [place]-Waitlist-4-5 patients ahead [place]-Diversion  [facility] has no appropriate beds. CSW will fax out referrals to other hospitals.  Christopher Short. Kaylyn Lim, MSW, LCSWA Disposition Clinical Social Work [phone] (cell) [phone] (office)

## 2019-01-06 ENCOUNTER — Encounter: Payer: Self-pay | Admitting: Psychiatry

## 2019-01-06 DIAGNOSIS — F1020 Alcohol dependence, uncomplicated: Secondary | ICD-10-CM | POA: Diagnosis present

## 2019-01-06 DIAGNOSIS — F17200 Nicotine dependence, unspecified, uncomplicated: Secondary | ICD-10-CM | POA: Diagnosis present

## 2019-01-06 DIAGNOSIS — F333 Major depressive disorder, recurrent, severe with psychotic symptoms: Principal | ICD-10-CM

## 2019-01-06 LAB — URINE CULTURE: Culture: 50000 — AB

## 2019-01-06 NOTE — Progress Notes (Signed)
Patient stayed in bed until snack time. Went to the dayroom, had a snack and stayed around until bedtime. Was pleasant on approach. Patient went to bed and fell asleep. No CIWA score at present. Patient has no sign of distress while asleep. Safety precautions maintained.
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
## 2019-01-06 NOTE — Progress Notes (Signed)
D: Patient stated slept good last night .Stated appetite  good and energy level   low. Stated concentration poor . Stated he is Depression  hopeless and anxiety . No auditory hallucinations.  No pain concerns . No ADL'S. Limited  Interacting with peers and staff. Continue  to educate patient on  unit programing . No attempts made to attend unit programing . Emotions and mental status remain  suicidal flat  and depressed  Continue to voice of suicidal ideations . No attempts made to attend unit programing . On approach patient noted to sleep upon wakening noted to go to [employer] .Patient stated he will attend groups tomorrow    A: Encourage patient participation with unit programming . Instruction  Given on  Medication , verbalize understanding.  R: Voice no other concerns. Staff continue to monitor

## 2019-01-06 NOTE — [facility] Suicide Risk Assessment (Signed)
[facility] Admission Suicide Risk Assessment   Nursing information obtained from:  Patient Demographic factors:  Male, Living alone, Age 73 or older Current Mental Status:  Suicidal ideation indicated by patient, Suicide plan Loss Factors:  Decline in physical health Historical Factors:  Prior suicide attempts Risk Reduction Factors:  Positive social support([employer]et )  Total Time spent with patient: 1 hour Principal Problem: Major depressive disorder, recurrent severe without psychotic features (HCC) Diagnosis:  Principal Problem:   Major depressive disorder, recurrent severe without psychotic features (HCC) Active Problems:   Cocaine use disorder, moderate, dependence (HCC)   PTSD (post-traumatic stress disorder)   Tobacco use disorder   Alcohol use disorder, moderate, dependence (HCC)  Subjective Data: suicidal ideation with a plan  Continued Clinical Symptoms:  Alcohol Use Disorder Identification Test Final Score (AUDIT): 4 The "Alcohol Use Disorders Identification Test", Guidelines for Use in Primary Care, Second Edition.  World Science writer [facility]). Score between 0-7:  no or low risk or alcohol related problems. Score between 8-15:  moderate risk of alcohol related problems. Score between 16-19:  high risk of alcohol related problems. Score 20 or above:  warrants further diagnostic evaluation for alcohol dependence and treatment.   CLINICAL FACTORS:   Severe Anxiety and/or Agitation Depression:   Comorbid alcohol abuse/dependence Impulsivity Insomnia Alcohol/Substance Abuse/Dependencies Currently Psychotic   Musculoskeletal: Strength & Muscle Tone: within normal limits Gait & Station: normal Patient leans: N/A  Psychiatric Specialty Exam: Physical Exam  Nursing note and vitals reviewed. Psychiatric: His speech is normal. His affect is blunt. He is withdrawn and actively hallucinating. Cognition and memory are normal. He expresses impulsivity. He exhibits a  depressed mood. He expresses suicidal ideation. He expresses suicidal plans.    Review of Systems  Neurological: Negative.   Psychiatric/Behavioral: Positive for depression, hallucinations, substance abuse and suicidal ideas. The patient has insomnia.   All other systems reviewed and are negative.   Blood pressure 119/85, pulse 71, temperature 97.8 F (36.6 C), temperature source Oral, resp. rate 18, height 6\' 4"  (1.93 m), weight 96.2 kg, SpO2 99 %.Body mass index is 25.81 kg/m.  General Appearance: Casual  Eye Contact:  Good  Speech:  Slurred  Volume:  Normal  Mood:  Anxious and Depressed  Affect:  Blunt and Depressed  Thought Process:  Goal Directed and Descriptions of Associations: Intact  Orientation:  Full (Time, Place, and Person)  Thought Content:  Hallucinations: Auditory  Suicidal Thoughts:  Yes.  with intent/plan  Homicidal Thoughts:  No  Memory:  Immediate;   Fair Recent;   Fair Remote;   Fair  Judgement:  Impaired  Insight:  Shallow  Psychomotor Activity:  Psychomotor Retardation  Concentration:  Concentration: Fair and Attention Span: Fair  Recall:  [employer] of Knowledge:  Fair  Language:  Fair  Akathisia:  No  Handed:  Right  AIMS (if indicated):     Assets:  Communication Skills Desire for Improvement Financial Resources/Insurance Physical Health Resilience  ADL's:  Intact  Cognition:  WNL  Sleep:         COGNITIVE FEATURES THAT CONTRIBUTE TO RISK:  None    SUICIDE RISK:   Moderate:  Frequent suicidal ideation with limited intensity, and duration, some specificity in terms of plans, no associated intent, good self-control, limited dysphoria/symptomatology, some risk factors present, and identifiable protective factors, including available and accessible social support.  PLAN OF CARE: hospital admission, medication management, substance abuse counseling, discharge planning.  Mr. Yahr is a 73 year old [place]  with a history of depression and  PTSD admitted for worsening of his symptoms with suicidal ideation and a plan to jump off the bridge.  #Mood -continue Seroquel 600 mg nightly -Trazodone 100 mg nightly  #PTSD -Minipress 1 mg nightly  #Alcohol abuse -[facility] CI[facility] -vital signs are stable -continue Neurontin 300 mg TID and Thiamine 100 mg  #Smoking cessation -nicotine patch is available  #COPD -continue inhalers  #Labs -lipid panel, TSH, A1C -EKG  #[facility] -[facility]  I certify that inpatient services furnished can reasonably be expected to improve the patient's condition.   Kristine LineaJolanta Pucilowska, MD 01/06/2019, 6:07 PM
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
## 2019-01-06 NOTE — [facility] Group Notes (Signed)
[facility] LCSW Group Therapy Note  Date/Time: 01/06/19, 1300  Type of Therapy/Topic:  Group Therapy:  Feelings about Diagnosis  Participation Level:  Did Not Attend   Mood:   Description of Group:    This group will allow patients to explore their thoughts and feelings about diagnoses they have received. Patients will be guided to explore their level of understanding and acceptance of these diagnoses. Facilitator will encourage patients to process their thoughts and feelings about the reactions of others to their diagnosis, and will guide patients in identifying ways to discuss their diagnosis with significant others in their lives. This group will be process-oriented, with patients participating in exploration of their own experiences as well as giving and receiving support and challenge from other group members.   Therapeutic Goals: 1. Patient will demonstrate understanding of diagnosis as evidence by identifying two or more symptoms of the disorder:  2. Patient will be able to express two feelings regarding the diagnosis 3. Patient will demonstrate ability to communicate their needs through discussion and/or role plays  Summary of Patient Progress:        Therapeutic Modalities:   Cognitive Behavioral Therapy Brief Therapy Feelings Identification   Greg Cyrah Mclamb, LCSW 

## 2019-01-06 NOTE — Progress Notes (Signed)
Recreation Therapy Notes  Date: 01/06/2019  Time: 9:30 am   Location: Craft room   Behavioral response: N/A   Intervention Topic: Problem Solving  Discussion/Intervention: Patient did not attend group.   Clinical Observations/Feedback:  Patient did not attend group.   Keivon Garden LRT/CTRS        Cash Duce 01/06/2019 11:36 AM 

## 2019-01-06 NOTE — Plan of Care (Signed)
  Problem: Education: Goal: Knowledge of [place] Education information/materials will improve Note:  Continue  to educate patient on  unit programing  Goal: Emotional status will improve Note:  No attempts made to attend unit programing . Emotions and mental status remain  suicidal flat  and depressed    Problem: Coping: Goal: Coping ability will improve Note:  Continue to voice of suicidal ideations . No attempts made to attend unit programing . On approach patient noted to sleep upon wakening noted to go to war stories     Problem: Safety: Goal: Ability to disclose and discuss suicidal ideas will improve Note:  Patient stated he will attend groups tomorrow

## 2019-01-06 NOTE — B[facility] Suicide Risk Assessment (Signed)
B[facility] INPATIENT:  Family/Significant Other Suicide Prevention Education  Suicide Prevention Education:  Patient Refusal for Family/Significant Other Suicide Prevention Education: The patient Christopher Short. has refused to provide written consent for family/significant other to be provided Family/Significant Other Suicide Prevention Education during admission and/or prior to discharge.  Physician notified.  Amour Trigg T Vonte Rossin 01/06/2019, 2:06 PM

## 2019-01-07 ENCOUNTER — Telehealth: Payer: Self-pay | Admitting: *Deleted

## 2019-01-07 NOTE — BHH Suicide Risk Assessment (Signed)
[facility] Discharge Suicide Risk Assessment   Principal Problem: Severe recurrent major depressive disorder with psychotic features [facility]) Discharge Diagnoses: Principal Problem:   Severe recurrent major depressive disorder with psychotic features (HCC) Active Problems:   Cocaine use disorder, moderate, dependence (HCC)   PTSD (post-traumatic stress disorder)   Tobacco use disorder   Alcohol use disorder, moderate, dependence (HCC)   Total Time spent with patient: 20 minutes plus 30 min on care coordination and documentation  Musculoskeletal: Strength & Muscle Tone: within normal limits Gait & Station: normal Patient leans: N/A  Psychiatric Specialty Exam: Review of Systems  Neurological: Negative.   Psychiatric/Behavioral: Positive for depression and substance abuse.  All other systems reviewed and are negative.   Blood pressure 123/87, pulse 80, temperature 98 F (36.7 C), temperature source Oral, resp. rate 18, height 6\' 4"  (1.93 m), weight 96.2 kg, SpO2 96 %.Body mass index is 25.81 kg/m.  General Appearance: Casual  Eye Contact::  Good  Speech:  Clear and Coherent409  Volume:  Normal  Mood:  Depressed, Hopeless and Worthless  Affect:  Flat  Thought Process:  Goal Directed and Descriptions of Associations: Intact  Orientation:  Full (Time, Place, and Person)  Thought Content:  WDL  Suicidal Thoughts:  Yes.  without intent/plan  Homicidal Thoughts:  No  Memory:  Immediate;   Fair Recent;   Fair Remote;   Fair  Judgement:  Impaired  Insight:  Shallow  Psychomotor Activity:  Decreased  Concentration:  Fair  Recall:  [employer] of Knowledge:Fair  Language: Fair  Akathisia:  No  Handed:  Right  AIMS (if indicated):     Assets:  Communication Skills Desire for Improvement Financial Resources/Insurance Physical Health Resilience  Sleep:  Number of Hours: 7  Cognition: WNL  ADL's:  Intact   Mental Status Per Nursing Assessment::   On Admission:  Suicidal ideation  indicated by patient, Suicide plan  Demographic Factors:  Male and Age 18 or older  Loss Factors: NA  Historical Factors: Prior suicide attempts and Impulsivity  Risk Reduction Factors:   Positive therapeutic relationship  Continued Clinical Symptoms:  Severe Anxiety and/or Agitation Depression:   Comorbid alcohol abuse/dependence Impulsivity  Cognitive Features That Contribute To Risk:  None    Suicide Risk:  Minimal: No identifiable suicidal ideation.  Patients presenting with no risk factors but with morbid ruminations; may be classified as minimal risk based on the severity of the depressive symptoms  Follow-up Information    [facility]   Why:  Please call [facility] to schedule appointment with a primary doctor. follow up with Dr. Windell Moment for psychiatry appointment on Tuesday 01/13/19 at 2:00pm.  Please bring your hospital discharge papers with you to your appointment.  Thank you. Contact information: [address] [facility] [place] [address] [phone]           Plan Of Care/Follow-up recommendations:  Activity:  as tolerated Diet:  low sodium heart healthy Other:  keep follow up appointments  Kristine Linea, MD 01/07/2019, 1:51 PM

## 2019-01-07 NOTE — Progress Notes (Signed)
Recreation Therapy Notes  Date: 01/07/2019  Time: 9:30 am   Location: Craft room   Behavioral response: N/A   Intervention Topic: Communication  Discussion/Intervention: Patient did not attend group.   Clinical Observations/Feedback:  Patient did not attend group.   Camila Norville LRT/CTRS        Camron Essman 01/07/2019 11:25 AM

## 2019-01-07 NOTE — Tx Team (Signed)
Interdisciplinary Treatment and Diagnostic Plan Update  01/07/2019 Time of Session: 1030am Christopher Stallsndrew Mish Jr. MRN: [id number]  Principal Diagnosis: Severe recurrent major depressive disorder with psychotic features (HCC)  Secondary Diagnoses: Principal Problem:   Severe recurrent major depressive disorder with psychotic features (HCC) Active Problems:   Cocaine use disorder, moderate, dependence (HCC)   PTSD (post-traumatic stress disorder)   Tobacco use disorder   Alcohol use disorder, moderate, dependence (HCC)   Current Medications:  Current Facility-Administered Medications  Medication Dose Route Frequency Provider Last Rate Last Dose  . acetaminophen (TYLENOL) tablet 650 mg  650 mg Oral Q6H PRN Mariel CraftMaurer, Sheila M, MD      . albuterol (PROVENTIL) (2.5 MG/3ML) 0.083% nebulizer solution 2.5 mg  2.5 mg Inhalation Q6H PRN Mariel CraftMaurer, Sheila M, MD      . alum & mag hydroxide-simeth (MAALOX/MYLANTA) 200-200-20 MG/5ML suspension 30 mL  30 mL Oral Q4H PRN Mariel CraftMaurer, Sheila M, MD      . cholecalciferol (VITAMIN D3) tablet 2,000 Units  2,000 Units Oral Daily Mariel CraftMaurer, Sheila M, MD   2,000 Units at 01/07/19 0802  . hydrocerin (EUCERIN) cream 1 application  1 application Topical Daily PRN Mariel CraftMaurer, Sheila M, MD      . magnesium hydroxide (MILK OF MAGNESIA) suspension 30 mL  30 mL Oral Daily PRN Mariel CraftMaurer, Sheila M, MD      . Melatonin TABS 5 mg  5 mg Oral QHS Mariel CraftMaurer, Sheila M, MD   5 mg at 01/06/19 2215  . nicotine (NICODERM CQ - dosed in mg/24 hours) patch 21 mg  21 mg Transdermal Daily Mariel CraftMaurer, Sheila M, MD   21 mg at 01/07/19 0804  . prazosin (MINIPRESS) capsule 1 mg  1 mg Oral QHS Mariel CraftMaurer, Sheila M, MD   1 mg at 01/06/19 2224  . QUEtiapine (SEROQUEL) tablet 600 mg  600 mg Oral QHS Mariel CraftMaurer, Sheila M, MD   600 mg at 01/06/19 2214  . talc powder 1 application  1 application Topical Daily PRN Mariel CraftMaurer, Sheila M, MD      . tiotropium [facility](SPIRIVA) inhalation capsule [facility] use ONLY) 18 mcg  18 mcg Inhalation Daily  Mariel CraftMaurer, Sheila M, MD   18 mcg at 01/07/19 0801  . traZODone (DESYREL) tablet 300 mg  300 mg Oral QHS Mariel CraftMaurer, Sheila M, MD   300 mg at 01/06/19 2214   PTA Medications: Medications Prior to Admission  Medication Sig Dispense Refill Last Dose  . albuterol (PROVENTIL HFA;VENTOLIN HFA) 108 (90 Base) MCG/ACT inhaler Inhale 2 puffs into the lungs every 6 (six) hours as needed for wheezing or shortness of breath.   week ago  . cephALEXin (KEFLEX) 500 MG capsule Take 2 capsules (1,000 mg total) by mouth 2 (two) times daily. 28 capsule 0   . Cholecalciferol (VITAMIN D) 50 MCG (2000 UT) tablet Take 2,000 Units by mouth daily.   month ago  . Melatonin 3 MG TABS Take 6 mg by mouth at bedtime.   month ago  . Multiple Vitamin (MULTIVITAMIN WITH MINERALS) TABS tablet Take 1 tablet by mouth daily.   month ago  . oxyCODONE-acetaminophen (PERCOCET) 5-325 MG tablet Take 1 tablet by mouth every 4 (four) hours as needed for severe pain. (Patient not taking: Reported on 01/03/2019) 15 tablet 0 Not Taking at Unknown time  . prazosin (MINIPRESS) 1 MG capsule Take 1 mg by mouth at bedtime.   month ago  . QUEtiapine (SEROQUEL) 300 MG tablet Take 600 mg by mouth at bedtime.   month ago  .  Skin Protectants, Misc. (EUCERIN) cream Apply 1 application topically daily as needed for dry skin (apply to feet).   few days ago  . talc powder Apply 1 application topically daily as needed (itching/discomfort on feet).   few days ago  . tiotropium (SPIRIVA) 18 MCG inhalation capsule Place 18 mcg into inhaler and inhale daily.   2 weeks ago  . traZODone (DESYREL) 150 MG tablet Take 300 mg by mouth at bedtime.   month ago    Patient Stressors: Health problems Medication change or noncompliance Substance abuse  Patient Strengths: Ability for insight Average or above average intelligence Capable of independent living Communication skills Supportive family/friends  Treatment Modalities: Medication Management, Group therapy, Case  management,  1 to 1 session with clinician, Psychoeducation, Recreational therapy.   Physician Treatment Plan for Primary Diagnosis: Severe recurrent major depressive disorder with psychotic features (HCC) Long Term Goal(s): Improvement in symptoms so as ready for discharge Improvement in symptoms so as ready for discharge   Short Term Goals: Ability to identify changes in lifestyle to reduce recurrence of condition will improve Ability to verbalize feelings will improve Ability to disclose and discuss suicidal ideas Ability to demonstrate self-control will improve Ability to identify and develop effective coping behaviors will improve Ability to maintain clinical measurements within normal limits will improve Compliance with prescribed medications will improve Ability to identify triggers associated with substance abuse/mental health issues will improve Ability to identify changes in lifestyle to reduce recurrence of condition will improve Ability to demonstrate self-control will improve Ability to identify triggers associated with substance abuse/mental health issues will improve  Medication Management: Evaluate patient's response, side effects, and tolerance of medication regimen.  Therapeutic Interventions: 1 to 1 sessions, Unit Group sessions and Medication administration.  Evaluation of Outcomes: Progressing  Physician Treatment Plan for Secondary Diagnosis: Principal Problem:   Severe recurrent major depressive disorder with psychotic features (HCC) Active Problems:   Cocaine use disorder, moderate, dependence (HCC)   PTSD (post-traumatic stress disorder)   Tobacco use disorder   Alcohol use disorder, moderate, dependence (HCC)  Long Term Goal(s): Improvement in symptoms so as ready for discharge Improvement in symptoms so as ready for discharge   Short Term Goals: Ability to identify changes in lifestyle to reduce recurrence of condition will improve Ability to verbalize  feelings will improve Ability to disclose and discuss suicidal ideas Ability to demonstrate self-control will improve Ability to identify and develop effective coping behaviors will improve Ability to maintain clinical measurements within normal limits will improve Compliance with prescribed medications will improve Ability to identify triggers associated with substance abuse/mental health issues will improve Ability to identify changes in lifestyle to reduce recurrence of condition will improve Ability to demonstrate self-control will improve Ability to identify triggers associated with substance abuse/mental health issues will improve     Medication Management: Evaluate patient's response, side effects, and tolerance of medication regimen.  Therapeutic Interventions: 1 to 1 sessions, Unit Group sessions and Medication administration.  Evaluation of Outcomes: Progressing   RN Treatment Plan for Primary Diagnosis: Severe recurrent major depressive disorder with psychotic features (HCC) Long Term Goal(s): Knowledge of disease and therapeutic regimen to maintain health will improve  Short Term Goals: Ability to verbalize feelings will improve, Ability to disclose and discuss suicidal ideas, Ability to identify and develop effective coping behaviors will improve and Compliance with prescribed medications will improve  Medication Management: RN will administer medications as ordered by provider, will assess and evaluate patient's response and provide  education to patient for prescribed medication. RN will report any adverse and/or side effects to prescribing provider.  Therapeutic Interventions: 1 on 1 counseling sessions, Psychoeducation, Medication administration, Evaluate responses to treatment, Monitor vital signs and CBGs as ordered, Perform/monitor CIWA, COWS, AIMS and Fall Risk screenings as ordered, Perform wound care treatments as ordered.  Evaluation of Outcomes:  Progressing   LCSW Treatment Plan for Primary Diagnosis: Severe recurrent major depressive disorder with psychotic features (HCC) Long Term Goal(s): Safe transition to appropriate next level of care at discharge, Engage patient in therapeutic group addressing interpersonal concerns.  Short Term Goals: Engage patient in aftercare planning with referrals and resources  Therapeutic Interventions: Assess for all discharge needs, 1 to 1 time with Social worker, Explore available resources and support systems, Assess for adequacy in community support network, Educate family and significant other(s) on suicide prevention, Complete Psychosocial Assessment, Interpersonal group therapy.  Evaluation of Outcomes: Progressing   Progress in Treatment: Attending groups: Yes. Participating in groups: Minimal Taking medication as prescribed: Yes. Toleration medication: Yes. Family/Significant other contact made: No, will contact:  pt declined Patient understands diagnosis: Yes. Discussing patient identified problems/goals with staff: Yes. Medical problems stabilized or resolved: Yes. Denies suicidal/homicidal ideation: Yes. Issues/concerns per patient self-inventory: No. Other: NA  New problem(s) identified: No, Describe:  none reported  New Short Term/Long Term [place](s[place] [facility]  [facility]:  [facility]"  Discharge Plan or Barriers: [facility]  Reason for Continuation of Hospitalization: Medication stabilization  Estimated Length of Stay: TBD  Attendees: Patient: Christopher Short 01/07/2019 3:20 PM  Physician: Kristine LineaJolanta Pucilowska MD 01/07/2019 3:20 PM  Nursing: Hulan AmatoGwen Farrish RN 01/07/2019 3:20 PM  RN Care Manager: 01/07/2019 3:20 PM  Social Worker: Lowella Dandyarren Atilano Covelli LCSW [place]a Stanfield LCSW [place]Olivia Moton LCSW 01/07/2019 3:20 PM  Recreational Therapist: Garret ReddishShay Outlaw LRT 01/07/2019 3:20 PM  Other:   01/07/2019 3:20 PM  Other:  01/07/2019 3:20 PM  Other: 01/07/2019 3:20 PM    Scribe for Treatment Team: Suzan SlickARREN T Loany Neuroth, LCSW 01/07/2019 3:20 PM

## 2019-01-07 NOTE — Plan of Care (Signed)
Patient was visibly seen in the milieu talking with peers , alert and spontaneous, aware of his coping skills , mood is good and affect, states appetite is good and sleep is restful good , patient voice no concerns,   Medication is taken regularly , patient has insights into  illness for hospitalization, exhibits speech that is normal, fully oriented articulate and social judgement is is intact, thought content is appropriate, no bizarre behaviors noted.  Education and encouragement is provided to enable patient identify positive attributes to self , no safety concerns , patient denies any SI/HI/AVH at this time and 15 minutes safety rounding is maintained no distress noted. Problem: Education: Goal: Knowledge of [facility] Education information/materials will improve Outcome: Progressing Goal: Emotional status will improve Outcome: Progressing Goal: Mental status will improve Outcome: Progressing Goal: Verbalization of understanding the information provided will improve Outcome: Progressing   Problem: Education: Goal: Knowledge of disease or condition will improve Outcome: Progressing Goal: Understanding of discharge needs will improve Outcome: Progressing   Problem: Coping: Goal: Coping ability will improve Outcome: Progressing   Problem: Safety: Goal: Ability to disclose and discuss suicidal ideas will improve Outcome: Progressing

## 2019-01-07 NOTE — Progress Notes (Signed)
Patient discharged to [facility].Patient denies SI/HI, denies A/V hallucinations. Patient verbalizes understanding of discharge instructions. Patient given all belongings from [facility] locker. Patient escorted out by staff, transported by [facility].Report given to [facility].

## 2019-01-07 NOTE — BHH Counselor (Signed)
CSW spoke with [facility] coordinator [place] [phone]) who reports the documents submitted by Dr. Kristine Linea for pt transfer on 01/06/19 were uploaded and sent to the providers. CSW will follow up to see when decision is made.

## 2019-01-07 NOTE — BHH Counselor (Signed)
CSW relayed to Dr. Jennet Maduro information being requested by [facility]: CBC, urine drug screen, BMP, IVC papers.She states she will fax this information to [facility] coordinator fax # [phone]; ph# [phone] ext:[phone])

## 2019-01-07 NOTE — Telephone Encounter (Signed)
Post ED Visit - Positive Culture Follow-up  Culture report reviewed by antimicrobial stewardship pharmacist:  []  Enzo Bi, Pharm.D. []  Celedonio Miyamoto, Pharm.D., BCPS AQ-ID []  Garvin Fila, Pharm.D., BCPS []  Georgina Pillion, Pharm.D., BCPS []  [place], [address].D., BCPS, AAHIVP []  Estella Husk, Pharm.D., BCPS, AAHIVP []  Lysle Pearl, PharmD, BCPS []  Phillips Climes, PharmD, BCPS [x]  Agapito Games, PharmD, BCPS []  Verlan Friends, PharmD  Positive urine culture Treated with Cephalexin, organism sensitive to the same and no further patient follow-up is required at this time.  [facility] 01/07/2019, 9:25 AM

## 2019-01-07 NOTE — Discharge Summary (Signed)
Physician Discharge Summary Note  Patient:  Christopher Stallsndrew Choi Jr. is an 73 y.o., male MRN:  [id number] DOB:  1946/08/08 Patient phone:  [phone] (home)  Patient address:   [address] [place] [phone],  Total Time spent with patient: 20 minutes plus 30 min on care coordination and documentation  Date of Admission:  01/05/2019 Date of Discharge: 01/07/2019  Reason for Admission:  Suicidal ideation with a plan.  History of Present Illness:   Identifying data. Mr. Christopher Short is a [age] year old [place] veteran with a history of depression and PTSD.  Chief complaint. "I really need help."  History of present illness. Information was obtained from the patient and the chart. The [facility] complaining of worsening depression with auditory hallucinations commanding him to jump off the bridge or step in front of the traffic. He reports that his problems started two weeks ago when he "gave up his apartment" because there were some banging noises there that triggered his PTSD with increased nightmares, flashbacks and hypervigilance. He claims to be compliant with his medications of Seroquel and Minipress [facility]. He minimizes substance use but apparently has been drinking quite a bit. Also using cocaine.  Past psychiatric history. He was fine before [place] where he did two tours in the [employer] and received two [employer]. Struggling with depression/PTSD ever since. [facility] time over the years. He has never been admitted outside of the [facility]. One suicide attempt by overdose 15 or so year ago.  Family psychiatric history. None reported.  Social history. Estranged from family. Used to live independently, possibly with a roommate who now kicked him out. This part of the story is not at all clear. Full [facility] the [facility].  Principal Problem: Severe recurrent major depressive disorder with psychotic features  [facility](HCC) Discharge Diagnoses: Principal Problem:   Severe recurrent major depressive disorder with psychotic features (HCC) Active Problems:   Cocaine use disorder, moderate, dependence (HCC)   PTSD (post-traumatic stress disorder)   Tobacco use disorder   Alcohol use disorder, moderate, dependence (HCC)    Past Medical History:  Past Medical History:  Diagnosis Date  . PTSD (post-traumatic stress disorder)    History reviewed. No pertinent surgical history. Family History: History reviewed. No pertinent family history.  Social History:  Social History   Substance and Sexual Activity  Alcohol Use Yes  . Frequency: Never   Comment: last drank 01-03-19 16 ox x 1 beer      Social History   Substance and Sexual Activity  Drug Use Never    Social History   Socioeconomic History  . Marital status: Single    Spouse name: Not on file  . Number of children: Not on file  . Years of education: Not on file  . Highest education level: Not on file  Occupational History  . Not on file  Social Needs  . Financial resource strain: Not on file  . Food insecurity:    Worry: Not on file    Inability: Not on file  . Transportation needs:    Medical: Not on file    Non-medical: Not on file  Tobacco Use  . Smoking status: Current Every Day Smoker    Packs/day: 1.00    Types: Cigarettes  . Smokeless tobacco: Never Used  Substance and Sexual Activity  . Alcohol use: Yes    Frequency: Never    Comment: last drank 01-03-19 16 ox  x 1 beer   . Drug use: Never  . Sexual activity: Not on file  Lifestyle  . Physical activity:    Days per week: Not on file    Minutes per session: Not on file  . Stress: Not on file  Relationships  . Social connections:    Talks on phone: Not on file    Gets together: Not on file    Attends religious service: Not on file    Active member of club or organization: Not on file    Attends meetings of clubs or organizations: Not on file    Relationship  status: Not on file  Other Topics Concern  . Not on file  Social History Narrative  . Not on file    Hospital Course:    Mr. Christopher Short is a 73 year old [place] veteran with a history of depression and chronic PTSD [facility] where he completed alcohol detox for treatment of suicidal ideation with a plan to jump off the bridge. The patient is fully service connected to the [facility] wher he has ben receiving excellent care over the years. [facility]. He is still vaguely suicidal but no intention or plan as long as he can get help at the [facility].  #Mood, improving -continue Seroquel 600 mg nightly -Trazodone 150 mg nightly  #PTSD -Minipress 1 mg nightly  #Alcohol abuse -completed CIWA protocol at [facility] -vital signs are stable  #Smoking cessation -nicotine patch is available  #COPD -continue inhalers  #UTI -urine culture positive for Strep Viridans -received Cephalexin 1000 mg [facility]  #Labs -lipid panel shows elevated TG, A1C 6.0 -EKG reviewed, sinus rhythm wit PACs and QTC 440  #Disposition -[facility] -unfortunately he came here voluntarily here -we initiated commitment  Physical Findings: AIMS:  , ,  ,  ,    CIWA:  CIWA-Ar Total: 0 COWS:     Musculoskeletal: Strength & Muscle Tone: within normal limits Gait & Station: normal Patient leans: N/A  Psychiatric Specialty Exam: Physical Exam  Nursing note and vitals reviewed. Psychiatric: His speech is normal and behavior is normal. His affect is blunt. Cognition and memory are normal. He expresses impulsivity. He exhibits a depressed mood. He expresses suicidal ideation.    Review of Systems  Neurological: Negative.   Psychiatric/Behavioral: Positive for depression, substance abuse and suicidal ideas.  All other systems reviewed and are negative.   Blood pressure 123/87, pulse 80, temperature 98 F  (36.7 C), temperature source Oral, resp. rate 18, height 6\' 4"  (1.93 m), weight 96.2 kg, SpO2 96 %.Body mass index is 25.81 kg/m.  General Appearance: Casual  Eye Contact:  Good  Speech:  Clear and Coherent  Volume:  Normal  Mood:  Depressed, Hopeless and Worthless  Affect:  Flat  Thought Process:  Goal Directed and Descriptions of Associations: Intact  Orientation:  Full (Time, Place, and Person)  Thought Content:  WDL  Suicidal Thoughts:  Yes.  without intent/plan  Homicidal Thoughts:  No  Memory:  Immediate;   Fair Recent;   Fair Remote;   Fair  Judgement:  Impaired  Insight:  Shallow  Psychomotor Activity:  Decreased  Concentration:  Concentration: Fair and Attention Span: Fair  Recall:  [employer]  Fund of Knowledge:  Fair  Language:  Fair  Akathisia:  No  Handed:  Right  AIMS (if indicated):  Assets:  Communication Skills Desire for Improvement Financial Resources/Insurance Physical Health Resilience  ADL's:  Intact  Cognition:  WNL  Sleep:  Number of Hours: 7     Have you used any form of tobacco in the last 30 days? (Cigarettes, Smokeless Tobacco, Cigars, and/or Pipes): Yes  Has this patient used any form of tobacco in the last 30 days? (Cigarettes, Smokeless Tobacco, Cigars, and/or Pipes) Yes, Yes, A prescription for an FDA-approved tobacco cessation medication was offered at discharge and the patient refused  Blood Alcohol level:  Lab Results  Component Value Date   ETH 45 (H) 01/03/2019    Metabolic Disorder Labs:  Lab Results  Component Value Date   HGBA1C 6.0 (H) 01/05/2019   MPG 125.5 01/05/2019   No results found for: PROLACTIN Lab Results  Component Value Date   CHOL 176 01/05/2019   TRIG 333 (H) 01/05/2019   HDL 44 01/05/2019   CHOLHDL 4.0 01/05/2019   VLDL 67 (H) 01/05/2019   LDLCALC 65 01/05/2019    See Psychiatric Specialty Exam and Suicide Risk Assessment completed by Attending Physician prior to discharge.  Discharge destination:   Other:  [facility]  Is patient on multiple antipsychotic therapies at discharge:  No   Has Patient had three or more failed trials of antipsychotic monotherapy by history:  No  Recommended Plan for Multiple Antipsychotic Therapies: NA  Discharge Instructions    Diet - low sodium heart healthy   Complete by:  As directed    Increase activity slowly   Complete by:  As directed      Allergies as of 01/07/2019   No Known Allergies     Medication List    STOP taking these medications   cephALEXin 500 MG capsule Commonly known as:  KEFLEX   oxyCODONE-acetaminophen 5-325 MG tablet Commonly known as:  PERCOCET     TAKE these medications     Indication  albuterol 108 (90 Base) MCG/ACT inhaler Commonly known as:  PROVENTIL HFA;VENTOLIN HFA Inhale 2 puffs into the lungs every 6 (six) hours as needed for wheezing or shortness of breath.  Indication:  Chronic Obstructive Lung Disease   eucerin cream Apply 1 application topically daily as needed for dry skin (apply to feet).  Indication:  dry skin   Melatonin 3 MG Tabs Take 6 mg by mouth at bedtime.  Indication:  Trouble Sleeping   multivitamin with minerals Tabs tablet Take 1 tablet by mouth daily.  Indication:  general health   prazosin 1 MG capsule Commonly known as:  MINIPRESS Take 1 mg by mouth at bedtime.  Indication:  Frightening Dreams   QUEtiapine 300 MG tablet Commonly known as:  SEROQUEL Take 600 mg by mouth at bedtime.  Indication:  Major Depressive Disorder   talc powder Apply 1 application topically daily as needed (itching/discomfort on feet).  Indication:  itching   tiotropium 18 MCG inhalation capsule Commonly known as:  SPIRIVA Place 18 mcg into inhaler and inhale daily.  Indication:  Chronic Obstructive Lung Disease   traZODone 150 MG tablet Commonly known as:  DESYREL Take 300 mg by mouth at bedtime.  Indication:  Trouble Sleeping   Vitamin D 50 MCG (2000 UT) tablet Take 2,000 Units by  mouth daily.  Indication:  general health      Follow-up Information    [facility].   Why:  Please call [facility] to schedule appointment with a primary doctor. follow up with Dr. Windell Moment for  psychiatry appointment on Tuesday 01/13/19 at 2:00pm.  Please bring your hospital discharge papers with you to your appointment.  Thank you. Contact information: [address] [facility] [place] [address] [phone]           Follow-up recommendations:  Activity:  as tolerated Diet:  low sodium heart healthy Other:  keep follow up appointments  Comments:    Signed: Kristine Linea, MD 01/07/2019, 1:55 PM

## 2019-01-07 NOTE — BHH Group Notes (Signed)
LCSW Group Therapy Note  01/07/2019 1:00 PM  Type of Therapy/Topic:  Group Therapy:  Emotion Regulation  Participation Level:  Minimal   Description of Group:   The purpose of this group is to assist patients in learning to regulate negative emotions and experience positive emotions. Patients will be guided to discuss ways in which they have been vulnerable to their negative emotions. These vulnerabilities will be juxtaposed with experiences of positive emotions or situations, and patients will be challenged to use positive emotions to combat negative ones. Special emphasis will be placed on coping with negative emotions in conflict situations, and patients will process healthy conflict resolution skills.  Therapeutic Goals: 1. Patient will identify two positive emotions or experiences to reflect on in order to balance out negative emotions 2. Patient will label two or more emotions that they find the most difficult to experience 3. Patient will demonstrate positive conflict resolution skills through discussion and/or role plays  Summary of Patient Progress: Patient was present for beginning of group and identified that he has been struggling with his PTSD as of late.  Patient left group after stating that another group member was loud and triggering him.    Therapeutic Modalities:   Cognitive Behavioral Therapy Feelings Identification Dialectical Behavioral Therapy  Penni Homans, MSW, LCSW 01/07/2019 2:52 PM

## 2019-01-31 ENCOUNTER — Encounter (HOSPITAL_COMMUNITY): Payer: Self-pay | Admitting: Emergency Medicine

## 2019-01-31 ENCOUNTER — Emergency Department (HOSPITAL_COMMUNITY)
Admission: EM | Admit: 2019-01-31 | Discharge: 2019-02-02 | Disposition: A | Discharge status: Home / Self Care | Payer: Medicaid Other | Attending: Emergency Medicine | Admitting: Emergency Medicine

## 2019-01-31 ENCOUNTER — Other Ambulatory Visit: Payer: Self-pay

## 2019-01-31 DIAGNOSIS — F4310 Post-traumatic stress disorder, unspecified: Secondary | ICD-10-CM | POA: Insufficient documentation

## 2019-01-31 DIAGNOSIS — F1414 Cocaine abuse with cocaine-induced mood disorder: Secondary | ICD-10-CM | POA: Diagnosis not present

## 2019-01-31 DIAGNOSIS — F329 Major depressive disorder, single episode, unspecified: Secondary | ICD-10-CM | POA: Diagnosis not present

## 2019-01-31 DIAGNOSIS — F17210 Nicotine dependence, cigarettes, uncomplicated: Secondary | ICD-10-CM | POA: Insufficient documentation

## 2019-01-31 DIAGNOSIS — F29 Unspecified psychosis not due to a substance or known physiological condition: Secondary | ICD-10-CM | POA: Insufficient documentation

## 2019-01-31 DIAGNOSIS — R45851 Suicidal ideations: Secondary | ICD-10-CM | POA: Insufficient documentation

## 2019-01-31 DIAGNOSIS — Z79899 Other long term (current) drug therapy: Secondary | ICD-10-CM | POA: Insufficient documentation

## 2019-01-31 LAB — CBC
HCT: 43.8 % (ref 39.0–52.0)
Hemoglobin: 13.5 g/dL (ref 13.0–17.0)
MCH: 27.1 pg (ref 26.0–34.0)
MCHC: 30.8 g/dL (ref 30.0–36.0)
MCV: 88 fL (ref 80.0–100.0)
Platelets: 286 10*3/uL (ref 150–400)
RBC: 4.98 MIL/uL (ref 4.22–5.81)
RDW: 16.7 % — AB (ref 11.5–15.5)
WBC: 6.6 10*3/uL (ref 4.0–10.5)
nRBC: 0 % (ref 0.0–0.2)

## 2019-01-31 LAB — SALICYLATE LEVEL: Salicylate Lvl: <7 mg/dL (ref 2.8–30.0)

## 2019-01-31 LAB — COMPREHENSIVE METABOLIC PANEL
ALT: 25 U/L (ref 0–44)
AST: 21 U/L (ref 15–41)
Albumin: 3.9 g/dL (ref 3.5–5.0)
Alkaline Phosphatase: 61 U/L (ref 38–126)
Anion gap: 9 (ref 5–15)
BUN: 10 mg/dL (ref 8–23)
CO2: 23 mmol/L (ref 22–32)
Calcium: 9.5 mg/dL (ref 8.9–10.3)
Chloride: 110 mmol/L (ref 98–111)
Creatinine, Ser: 1.03 mg/dL (ref 0.61–1.24)
GFR calc non Af Amer: >60 mL/min (ref >60)
Glucose, Bld: 102 mg/dL — ABNORMAL HIGH (ref 70–99)
Potassium: 3.3 mmol/L — ABNORMAL LOW (ref 3.5–5.1)
Sodium: 142 mmol/L (ref 135–145)
Total Bilirubin: 0.5 mg/dL (ref 0.3–1.2)
Total Protein: 7.5 g/dL (ref 6.5–8.1)

## 2019-01-31 LAB — ACETAMINOPHEN LEVEL: Acetaminophen (Tylenol), Serum: <10 ug/mL — ABNORMAL LOW (ref 10–30)

## 2019-01-31 LAB — ETHANOL

## 2019-01-31 MED ORDER — ACETAMINOPHEN 325 MG PO TABS: 650.0000 mg | ORAL_TABLET | Freq: Four times a day (QID) | ORAL | Status: DC | PRN

## 2019-01-31 MED ORDER — ALBUTEROL SULFATE HFA 108 (90 BASE) MCG/ACT IN AERS: 2.0000 | INHALATION_SPRAY | Freq: Four times a day (QID) | RESPIRATORY_TRACT | Status: DC | PRN

## 2019-01-31 MED ORDER — TRAZODONE HCL 100 MG PO TABS
300.0000 mg | ORAL_TABLET | Freq: Every day | ORAL | Status: DC
  Administered 2019-01-31 – 2019-02-01 (×2): 300 mg via ORAL
  Filled 2019-01-31: qty 42
  Filled 2019-01-31 (×2): qty 3
  Filled 2019-01-31: qty 42

## 2019-01-31 MED ORDER — UMECLIDINIUM BROMIDE 62.5 MCG/INH IN AEPB
1.0000 | INHALATION_SPRAY | Freq: Every day | RESPIRATORY_TRACT | Status: DC
  Administered 2019-02-01: 1 via RESPIRATORY_TRACT
  Filled 2019-01-31: qty 7

## 2019-01-31 NOTE — ED Notes (Signed)
Bed: [facility]3 Expected date:  Expected time:  Means of arrival:  Comments: 

## 2019-01-31 NOTE — ED Notes (Signed)
Pt has 3 belongings bags in the triage cabinets

## 2019-01-31 NOTE — BH Assessment (Addendum)
Assessment Note  Christopher Short. is an 73 y.o. male who presents to the ED voluntarily. Pt states he is suicidal and states he has thoughts of walking into traffic. Pt states he has been feeling suicidal for the past several days. Pt reports he recently ended a relationship with his girlfriend. Pt states he got into an argument with his roommate and he is unsure if he can return to his former living arrangement. Pt states he has PTSD and hears voices that tell him to kill himself. Pt states he feels hopeless due to "one thing after another going wrong" and has no desire to live. Pt states he has attempted suicide in the past and has a hx of MH treatment at the [facility] hospital. Pt was recently d/c from [facility] inpt facility c/o depression and PTSD. TTS asked the pt for permission to contact his support system and he declined stating he does not want anyone to speak with TTS regarding this matter.  Per Nira Conn, NP pt is recommended for inpt tx. TTS to seek placement. EDP Arby Barrette, MD and pt's nurse Reita Cliche, RN have been advised.   Diagnosis: MDD, recurrent, severe w/ psychosis; PTSD; Cocaine use disorder, severe; Alcohol use disorder, moderate  Past Medical History:  Past Medical History:  Diagnosis Date  . PTSD (post-traumatic stress disorder)     History reviewed. No pertinent surgical history.  Family History: History reviewed. No pertinent family history.  Social History:  reports that he has been smoking cigarettes. He has been smoking about 1.00 pack per day. He has never used smokeless tobacco. He reports current alcohol use. He reports that he does not use drugs.  Additional Social History:  Alcohol / Drug Use Pain Medications: See MAR Prescriptions: See MAR Over the Counter: See MAR History of alcohol / drug use?: Yes Longest period of sobriety (when/how long): none reported Negative Consequences of Use: Personal relationships Withdrawal Symptoms: Patient aware of  relationship between substance abuse and physical/medical complications Substance #1 Name of Substance 1: Cocaine (crack) 1 - Age of First Use: 20's 1 - Amount (size/oz): Approximately 1 gram 1 - Frequency: rare 1 - Duration: ongoing 1 - Last Use / Amount: 01/28/19 Substance #2 Name of Substance 2: Alcohol 2 - Age of First Use: 20 2 - Amount (size/oz): fifth 2 - Frequency: 3x/week 2 - Duration: ongoing 2 - Last Use / Amount: 01/28/19  CIWA: CIWA-Ar BP: (!) 145/88 Pulse Rate: 82 COWS:    Allergies: No Known Allergies  Home Medications: (Not in a hospital admission)   OB/GYN Status:  No LMP for male patient.  General Assessment Data [facility]: [facility] TTS Assessment: In system Is this a Tele or Face-to-Face Assessment?: Face-to-Face Is this an Initial Assessment or a Re-assessment for this encounter?: Initial Assessment Patient Accompanied by:: N/A Language Other than English: No Living Arrangements: Other (Comment) What gender do you identify as?: Male Marital status: Divorced Pregnancy Status: No Living Arrangements: Non-relatives/Friends Can pt return to current living arrangement?: (pt unsure, says he got into argument with roommate) Admission Status: Voluntary Is patient capable of signing voluntary admission?: Yes Referral Source: Self/Family/Friend Insurance type: [facility]     Crisis Care Plan Living Arrangements: Non-relatives/Friends Name of Psychiatrist: [place] [facility] Name of Therapist: [place] [facility]  Education Status Is patient currently in school?: No Is the patient employed, unemployed or receiving disability?: Receiving disability income  Risk to self with the past 6 months Suicidal Ideation: Yes-Currently Present Has patient  been a risk to self within the past 6 months prior to admission? : Yes Suicidal Intent: No Has patient had any suicidal intent within the past 6 months prior to admission? : No Is patient at risk for suicide?:  Yes Suicidal Plan?: Yes-Currently Present Has patient had any suicidal plan within the past 6 months prior to admission? : Yes Specify Current Suicidal Plan: pt states he has thoughts of walking in front of traffic  Access to Means: Yes Specify Access to Suicidal Means: pt has access to traffic  What has been your use of drugs/alcohol within the last 12 months?: cocaine, alcohol Previous Attempts/Gestures: Yes How many times?: 1 Other Self Harm Risks: hx of PTSD, suicide attempts, depression, substance abuse  Triggers for Past Attempts: Unpredictable Intentional Self Injurious Behavior: None Family Suicide History: No Recent stressful life event(s): Conflict (Comment), Turmoil (Comment)(argue with roommate, ended relationship) Persecutory voices/beliefs?: Yes Depression: Yes Depression Symptoms: Despondent, Feeling worthless/self pity, Loss of interest in usual pleasures, Insomnia Substance abuse history and/or treatment for substance abuse?: Yes Suicide prevention information given to non-admitted patients: Not applicable  Risk to Others within the past 6 months Homicidal Ideation: No-Not Currently/Within Last 6 Months Does patient have any lifetime risk of violence toward others beyond the six months prior to admission? : Yes (comment)(pt has thoughts of hurting ex-girlfriend) Thoughts of Harm to Others: Yes-Currently Present Comment - Thoughts of Harm to Others: pt states he has thoughts of hurting his ex-girlfriend because of the way she treats him  Current Homicidal Intent: No Current Homicidal Plan: No Access to Homicidal Means: No Identified Victim: ex-girlfriend, does not provide her name  History of harm to others?: No Assessment of Violence: None Noted Does patient have access to weapons?: No(pt says his nephew has his guns) Criminal Charges Pending?: No Does patient have a court date: No Is patient on probation?: No  Psychosis Hallucinations: Auditory, Visual, With  command Delusions: Unspecified  Mental Status Report Appearance/Hygiene: In scrubs Eye Contact: Good Motor Activity: Freedom of movement Speech: Logical/coherent Level of Consciousness: Alert Mood: Depressed, Sad Affect: Depressed Anxiety Level: None Thought Processes: Coherent, Relevant Judgement: Impaired Orientation: Person, Place, Time, Situation, Appropriate for developmental age Obsessive Compulsive Thoughts/Behaviors: None  Cognitive Functioning Concentration: Normal Memory: Remote Intact, Recent Intact Is patient IDD: No Insight: Fair Impulse Control: Fair Appetite: Good Have you had any weight changes? : No Change Sleep: Decreased Total Hours of Sleep: 5 Vegetative Symptoms: None  ADLScreening [facility] Assessment Services) Patient's cognitive ability adequate to safely complete daily activities?: Yes Patient able to express need for assistance with ADLs?: Yes Independently performs ADLs?: Yes (appropriate for developmental age)  Prior Inpatient Therapy Prior Inpatient Therapy: Yes Prior Therapy Dates: 2020, 2019, 2018 Prior Therapy Facilty/Provider(s): [facility], [facility] Reason for Treatment: PTSD; MDD; PSYCHOSIS  Prior Outpatient Therapy Prior Outpatient Therapy: Yes Prior Therapy Dates: ONGOING Prior Therapy Facilty/Provider(s): [facility] Reason for Treatment: MED MANAGEMENT Does patient have an ACCT team?: No Does patient have Intensive In-House Services?  : No Does patient have [facility] services? : No Does patient have P4CC services?: No  ADL Screening (condition at time of admission) Patient's cognitive ability adequate to safely complete daily activities?: Yes Is the patient deaf or have difficulty hearing?: No Does the patient have difficulty seeing, even when wearing glasses/contacts?: No Does the patient have difficulty concentrating, remembering, or making decisions?: No Patient able to express need for assistance with ADLs?: Yes Does the  patient have difficulty dressing or bathing?: No  Independently performs ADLs?: Yes (appropriate for developmental age) Does the patient have difficulty walking or climbing stairs?: No Weakness of Legs: None Weakness of Arms/Hands: None  Home Assistive Devices/Equipment Home Assistive Devices/Equipment: Dentures (specify type)    Abuse/Neglect Assessment (Assessment to be complete while patient is alone) Abuse/Neglect Assessment Can Be Completed: Yes Physical Abuse: Denies Verbal Abuse: Denies Sexual Abuse: Denies Exploitation of patient/patient's resources: Denies Self-Neglect: Denies     Merchant navy officer (For Healthcare) Does Patient Have a Medical Advance Directive?: Yes Type of Advance Directive: Healthcare Power of Attorney(pt says his daughter Kyros Salzwedel is his POA ) Copy of Healthcare Power of Attorney in Chart?: No - copy requested Would patient like information on creating a medical advance directive?: No - Patient declined          Disposition: Per Nira Conn, NP pt is recommended for inpt tx. TTS to seek placement. EDP Arby Barrette, MD and pt's nurse Reita Cliche, RN have been advised.   Disposition Initial Assessment Completed for this Encounter: Yes Disposition of Patient: Admit Type of inpatient treatment program: Adult Patient refused recommended treatment: No  On Site Evaluation by:   Reviewed with Physician:    Karolee Ohs 01/31/2019 9:55 PM

## 2019-01-31 NOTE — ED Notes (Signed)
Bed: [facility] Expected date:  Expected time:  Means of arrival:  Comments: 

## 2019-01-31 NOTE — ED Notes (Signed)
Bed: [facility]30 Expected date:  Expected time:  Means of arrival:  Comments: 

## 2019-01-31 NOTE — ED Provider Notes (Signed)
[facility]-EMERGENCY DEPT Provider Note   CSN: [id number] Arrival date & time: 01/31/19  1920    History   Chief Complaint Chief Complaint  Patient presents with  . Suicidal    HPI Christopher Short. is a 73 y.o. male.     HPI Patient reports he continues to feel suicidal despite recent treatment for suicidal ideation.  He reports he has had a lot of things going wrong in his life this year.  He is having problems with his roommate, his girlfriend and a more recent death in the family.  He reports he was thinking about jumping in front of a vehicle or off of a bridge.  Patient denies any medical problems.  He denies any positives on review of systems. Past Medical History:  Diagnosis Date  . PTSD (post-traumatic stress disorder)     Patient Active Problem List   Diagnosis Date Noted  . Tobacco use disorder 01/06/2019  . Alcohol use disorder, moderate, dependence (HCC) 01/06/2019  . Severe recurrent major depressive disorder with psychotic features (HCC) 01/05/2019  . Malingering 08/13/2017  . Cocaine use disorder, moderate, dependence (HCC) 07/01/2016  . PTSD (post-traumatic stress disorder) 07/01/2016    History reviewed. No pertinent surgical history.      Home Medications    Prior to Admission medications   Medication Sig Start Date End Date Taking? Authorizing Provider  albuterol (PROVENTIL HFA;VENTOLIN HFA) 108 (90 Base) MCG/ACT inhaler Inhale 2 puffs into the lungs every 6 (six) hours as needed for wheezing or shortness of breath.    [provider]  Cholecalciferol (VITAMIN D) 50 MCG (2000 UT) tablet Take 2,000 Units by mouth daily.    [provider]  Melatonin 3 MG TABS Take 6 mg by mouth at bedtime.    [provider]  Multiple Vitamin (MULTIVITAMIN WITH MINERALS) TABS tablet Take 1 tablet by mouth daily.    [provider]  prazosin (MINIPRESS) 1 MG capsule Take 1 mg by mouth at bedtime.     [provider]  QUEtiapine (SEROQUEL) 300 MG tablet Take 600 mg by mouth at bedtime.    [provider]  Skin Protectants, Misc. (EUCERIN) cream Apply 1 application topically daily as needed for dry skin (apply to feet).    [provider]  talc powder Apply 1 application topically daily as needed (itching/discomfort on feet).    [provider]  tiotropium (SPIRIVA) 18 MCG inhalation capsule Place 18 mcg into inhaler and inhale daily.    [provider]  traZODone (DESYREL) 150 MG tablet Take 300 mg by mouth at bedtime.    [provider]    Family History History reviewed. No pertinent family history.  Social History Social History   Tobacco Use  . Smoking status: Current Every Day Smoker    Packs/day: 1.00    Types: Cigarettes  . Smokeless tobacco: Never Used  Substance Use Topics  . Alcohol use: Yes    Frequency: Never    Comment: last drank 01-03-19 16 ox x 1 beer   . Drug use: Never     Allergies   Patient has no known allergies.   Review of Systems Review of Systems 10 Systems reviewed and are negative for acute change except as noted in the HPI.   Physical Exam Updated Vital Signs BP (!) 145/88 (BP Location: Left Arm)   Pulse 82   Temp 98.1 F (36.7 C) (Oral)   Resp 18   Ht  6\' 4"  (1.93 m)   Wt 104.3 kg   SpO2 97%   BMI 28.00 kg/m   Physical Exam Constitutional:      Appearance: He is well-developed.  HENT:     Head: Normocephalic and atraumatic.     Mouth/Throat:     Mouth: Mucous membranes are moist.     Pharynx: Oropharynx is clear.  Eyes:     Extraocular Movements: Extraocular movements intact.     Pupils: Pupils are equal, round, and reactive to light.  Neck:     Musculoskeletal: Neck supple.  Cardiovascular:     Rate and Rhythm: Normal rate and regular rhythm.     Heart sounds: Normal heart sounds.  Pulmonary:     Effort: Pulmonary effort is normal.     Breath sounds: Normal breath  sounds.  Abdominal:     General: Bowel sounds are normal. There is no distension.     Palpations: Abdomen is soft.     Tenderness: There is no abdominal tenderness.  Musculoskeletal: Normal range of motion.  Skin:    General: Skin is warm and dry.  Neurological:     Mental Status: He is alert and oriented to person, place, and time.     GCS: GCS eye subscore is 4. GCS verbal subscore is 5. GCS motor subscore is 6.     Coordination: Coordination normal.      ED Treatments / Results  Labs (all labs ordered are listed, but only abnormal results are displayed) Labs Reviewed  COMPREHENSIVE METABOLIC PANEL - Abnormal; Notable for the following components:      Result Value   Potassium 3.3 (*)    Glucose, Bld 102 (*)    All other components within normal limits  ACETAMINOPHEN LEVEL - Abnormal; Notable for the following components:   Acetaminophen (Tylenol), Serum <10 (*)    All other components within normal limits  CBC - Abnormal; Notable for the following components:   RDW 16.7 (*)    All other components within normal limits  ETHANOL  SALICYLATE LEVEL  RAPID URINE DRUG SCREEN, HOSP PERFORMED    EKG None  Radiology No results found.  Procedures Procedures (including critical care time)  Medications Ordered in ED Medications  albuterol (PROVENTIL HFA;VENTOLIN HFA) 108 (90 Base) MCG/ACT inhaler 2 puff (has no administration in time range)  tiotropium (SPIRIVA) inhalation capsule ([facility] use ONLY) 18 mcg (has no administration in time range)  traZODone (DESYREL) tablet 300 mg (has no administration in time range)  acetaminophen (TYLENOL) tablet 650 mg (has no administration in time range)     Initial Impression / Assessment and Plan / ED Course  I have reviewed the triage vital signs and the nursing notes.  Pertinent labs & imaging results that were available during my care of the patient were reviewed by me and considered in my medical decision making (see chart for  details).       Patient is alert and appropriate.  No signs of acute medical illness or complication.  Patient with chronic recurrent depression and reported suicidal ideation.  Patient medically cleared for psychiatric consultation.  Final Clinical Impressions(s) / ED Diagnoses   Final diagnoses:  Suicidal ideation    ED Discharge Orders    None       Arby Barrette, MD 01/31/19 2132

## 2019-02-01 DIAGNOSIS — F1414 Cocaine abuse with cocaine-induced mood disorder: Secondary | ICD-10-CM | POA: Diagnosis present

## 2019-02-01 LAB — RAPID URINE DRUG SCREEN, HOSP PERFORMED
AMPHETAMINES: NOT DETECTED
Barbiturates: NOT DETECTED
Benzodiazepines: NOT DETECTED
Cocaine: POSITIVE — AB
Opiates: NOT DETECTED
Tetrahydrocannabinol: NOT DETECTED

## 2019-02-01 MED ORDER — PRAZOSIN HCL 1 MG PO CAPS
1.0000 mg | ORAL_CAPSULE | Freq: Every day | ORAL | Status: DC
  Filled 2019-02-01 (×3): qty 21

## 2019-02-01 MED ORDER — QUETIAPINE FUMARATE 200 MG PO TABS
600.0000 mg | ORAL_TABLET | Freq: Every day | ORAL | Status: DC
  Administered 2019-02-01: 600 mg via ORAL
  Filled 2019-02-01 (×3): qty 63
  Filled 2019-02-01: qty 6

## 2019-02-01 NOTE — ED Notes (Signed)
Pt dc'd to [facility] in [place], [facility] staff will come at 0100 to pick him up. Consulting civil engineer and NP notified. Pt's meds that will DC with him at nursing station, RN will pass on to next RN.

## 2019-02-01 NOTE — Patient Outreach (Addendum)
Patient was accepted and agreeable to go to [facility] in [place] for residential substance use treatment for his cocaine and alcohol use. An [facility] staff member will pick up the patient at the [facility] at 1:00am 02/02/19. Patient was pleasant and expressed gratitude for the [facility] help with the substance use treatment referral to [facility]. [facility] also provided the patient with [facility] contact information. [facility] strongly encouraged the patient to continue to stay in contact with [facility] for further substance use recovery support with lived recovery experience from [facility] after discharge from the [facility].

## 2019-02-01 NOTE — Consult Note (Addendum)
[facility] Psych ED Discharge  02/01/2019 12:09 PM Harland Dingwall.  MRN:  [id number] Principal Problem: Cocaine abuse with cocaine-induced mood disorder [facility]) Discharge Diagnoses: Principal Problem:   Cocaine abuse with cocaine-induced mood disorder [facility])  Subjective: 73 yo male who presented to the ED after using cocaine and having suicidal ideations.  Today, he denies suicidal ideations along with homicidal ideations, hallucinations, and withdrawal symptoms.  Cocaine use increases with money and stress, decreases with lack of money.  His big issue is being homeless after having a fight with his girlfriend.  He was admitted to [facility] and discharged on 01/07/19 and went to the [facility], recently discharged.  Restarted using cocaine once he was released.  Peer support consult placed and referring to [facility] for rehab.  Total Time spent with patient: 45 minutes  Past Psychiatric History: PTSD, substance abuse  Past Medical History:  Past Medical History:  Diagnosis Date  . PTSD (post-traumatic stress disorder)    History reviewed. No pertinent surgical history. Family History: History reviewed. No pertinent family history. Family Psychiatric  History: None Social History:  Social History   Substance and Sexual Activity  Alcohol Use Yes  . Frequency: Never   Comment: last drank 01-03-19 16 ox x 1 beer      Social History   Substance and Sexual Activity  Drug Use Never    Social History   Socioeconomic History  . Marital status: Single    Spouse name: Not on file  . Number of children: Not on file  . Years of education: Not on file  . Highest education level: Not on file  Occupational History  . Not on file  Social Needs  . Financial resource strain: Not on file  . Food insecurity:    Worry: Not on file    Inability: Not on file  . Transportation needs:    Medical: Not on file    Non-medical: Not on file  Tobacco Use  . Smoking status: Current Every Day Smoker     Packs/day: 1.00    Types: Cigarettes  . Smokeless tobacco: Never Used  Substance and Sexual Activity  . Alcohol use: Yes    Frequency: Never    Comment: last drank 01-03-19 16 ox x 1 beer   . Drug use: Never  . Sexual activity: Not on file  Lifestyle  . Physical activity:    Days per week: Not on file    Minutes per session: Not on file  . Stress: Not on file  Relationships  . Social connections:    Talks on phone: Not on file    Gets together: Not on file    Attends religious service: Not on file    Active member of club or organization: Not on file    Attends meetings of clubs or organizations: Not on file    Relationship status: Not on file  Other Topics Concern  . Not on file  Social History Narrative  . Not on file    Has this patient used any form of tobacco in the last 30 days? (Cigarettes, Smokeless Tobacco, Cigars, and/or Pipes) A prescription for an FDA-approved tobacco cessation medication was offered at discharge and the patient refused  Current Medications: Current Facility-Administered Medications  Medication Dose Route Frequency Provider Last Rate Last Dose  . acetaminophen (TYLENOL) tablet 650 mg  650 mg Oral Q6H PRN Arby Barrette, MD      . albuterol (PROVENTIL HFA;VENTOLIN HFA) 108 (90 Base) MCG/ACT inhaler  2 puff  2 puff Inhalation Q6H PRN Arby Barrette, MD      . traZODone (DESYREL) tablet 300 mg  300 mg Oral QHS Arby Barrette, MD   300 mg at 01/31/19 2349  . umeclidinium bromide (INCRUSE ELLIPTA) 62.5 MCG/INH 1 puff  1 puff Inhalation Daily Arby Barrette, MD   1 puff at 02/01/19 1005   Current Outpatient Medications  Medication Sig Dispense Refill  . albuterol (PROVENTIL HFA;VENTOLIN HFA) 108 (90 Base) MCG/ACT inhaler Inhale 2 puffs into the lungs every 6 (six) hours as needed for wheezing or shortness of breath.    . Cholecalciferol (VITAMIN D) 50 MCG (2000 UT) tablet Take 2,000 Units by mouth daily.    . Melatonin 3 MG TABS Take 6 mg by mouth at  bedtime.    . Multiple Vitamin (MULTIVITAMIN WITH MINERALS) TABS tablet Take 1 tablet by mouth daily.    . prazosin (MINIPRESS) 1 MG capsule Take 1 mg by mouth at bedtime.    Marland Kitchen QUEtiapine (SEROQUEL) 300 MG tablet Take 600 mg by mouth at bedtime.    . Skin Protectants, Misc. (EUCERIN) cream Apply 1 application topically daily as needed for dry skin (apply to feet).    . talc powder Apply 1 application topically daily as needed (itching/discomfort on feet).    Marland Kitchen tiotropium (SPIRIVA) 18 MCG inhalation capsule Place 18 mcg into inhaler and inhale daily.    . traZODone (DESYREL) 150 MG tablet Take 300 mg by mouth at bedtime.     PTA Medications: (Not in a hospital admission)   Musculoskeletal: Strength & Muscle Tone: within normal limits Gait & Station: normal Patient leans: N/A  Psychiatric Specialty Exam: Physical Exam  Nursing note and vitals reviewed. Constitutional: He is oriented to person, place, and time. He appears well-developed and well-nourished.  HENT:  Head: Normocephalic.  Neck: Normal range of motion.  Respiratory: Effort normal.  Musculoskeletal: Normal range of motion.  Neurological: He is alert and oriented to person, place, and time.  Psychiatric: His speech is normal and behavior is normal. Judgment and thought content normal. His mood appears anxious. Cognition and memory are normal.    Review of Systems  Psychiatric/Behavioral: Positive for substance abuse. The patient is nervous/anxious.   All other systems reviewed and are negative.   Blood pressure 116/85, pulse 85, temperature 98.7 F (37.1 C), temperature source Oral, resp. rate 16, height 6\' 4"  (1.93 m), weight 104.3 kg, SpO2 94 %.Body mass index is 28 kg/m.  General Appearance: Casual  Eye Contact:  Good  Speech:  Normal Rate  Volume:  Normal  Mood:  Anxious  Affect:  Congruent  Thought Process:  Coherent and Descriptions of Associations: Intact  Orientation:  Full (Time, Place, and Person)   Thought Content:  WDL and Logical  Suicidal Thoughts:  No  Homicidal Thoughts:  No  Memory:  Immediate;   Good Recent;   Good Remote;   Good  Judgement:  Fair  Insight:  Fair  Psychomotor Activity:  Normal  Concentration:  Concentration: Good and Attention Span: Good  Recall:  Good  Fund of Knowledge:  Fair  Language:  Good  Akathisia:  No  Handed:  Right  AIMS (if indicated):     Assets:  Leisure Time Physical Health Resilience  ADL's:  Intact  Cognition:  WNL  Sleep:        Demographic Factors:  Male and Age 26 or older  Loss Factors: NA  Historical Factors: NA  Risk Reduction Factors:  Positive social support and Positive therapeutic relationship  Continued Clinical Symptoms:  Anxiety   Cognitive Features That Contribute To Risk:  None    Suicide Risk:  Minimal: No identifiable suicidal ideation.  Patients presenting with no risk factors but with morbid ruminations; may be classified as minimal risk based on the severity of the depressive symptoms   Plan Of Care/Follow-up recommendations:  Cocaine abuse with cocaine induced mood disorder: -Restarted Seroquel 600 mg at bedtime  Insomnia -Restarted Trazodone 300 mg at bedtime  Nightmares: -Restarted Prazosin 1 mg at bedtime  Peer support consult -Referral to [facility] Activity:  as tolerated Diet:  heart healthy diet  Disposition: discharge home with [facility] referral Nanine Means, NP 02/01/2019, 12:09 PM  Patient seen face-to-face for psychiatric evaluation, chart reviewed and case discussed with the physician extender and developed treatment plan. Reviewed the information documented and agree with the treatment plan. Thedore Mins, MD

## 2019-02-01 NOTE — Patient Outreach (Signed)
CPSS met with the patient to provide substance use recovery support and help with recovery resources. Patient is interested in [facility] in [place] for residential substance use treatment. [facility] currently has beds available, and CPSS will continue to work on [facility] referral. CPSS will keep the patient updated on the status of this referral.

## 2019-02-01 NOTE — Progress Notes (Signed)
CSW met with patient at bedside to discuss shelter resources. Patient declined to review or discuss shelter resources, stating several times "I can't go a shelter, there's drugs there."  [facility]. CSW explained that patient is psychiatrically cleared and not recommended for inpatient treatment, so a referral would not be appropriate. Patient voiced understanding.  [facility] [place]. From there, the [facility] reportedly placed him in an ALF, patient reports "they took my $1,100 and I left. There were roaches and rats. I caught a bus to [place] after a couple of days." Patient has been staying with a friend/roommate for a couple days but reports having a falling out and cannot return. He states he is originally from [facility] and wants to go back.  Patient declined again to review shelter resources. He states he will wait until he gets a bed at [facility].  Printed shelter list left on patient's bedside table. CSW signing off. Please reconsult if social work needs arise.   Stephanie Acre, LCSW-A Clinical Social Worker

## 2019-02-01 NOTE — Patient Outreach (Addendum)
An [facility] staff member called back to inform CPSS that the patient will no be longer accepted at [facility] for residential substance use treatment. The patient was denied due to issues with the patient's VA benefits combined with his medicaid/medicare health insurance. Per DNP Otis Brace, the patient will be observed and monitored for saftey overnight. CPSS will follow up with the patient in the morning 02/02/19 to provide further help with getting the patient connected to substance use treatment resources. CPSS provided the patient with information for several different substance use recovery resources including a [employer], NA meeting list, and residential/outpatient substance use treatment center list.

## 2019-02-02 ENCOUNTER — Emergency Department (HOSPITAL_COMMUNITY)
Admission: EM | Admit: 2019-02-02 | Discharge: 2019-02-03 | Disposition: A | Discharge status: Home / Self Care | Payer: Medicaid Other | Source: Home / Self Care | Attending: Emergency Medicine | Admitting: Emergency Medicine

## 2019-02-02 ENCOUNTER — Encounter (HOSPITAL_COMMUNITY): Payer: Self-pay

## 2019-02-02 DIAGNOSIS — Z5900 Homelessness unspecified: Secondary | ICD-10-CM

## 2019-02-02 DIAGNOSIS — F1410 Cocaine abuse, uncomplicated: Secondary | ICD-10-CM

## 2019-02-02 DIAGNOSIS — R45851 Suicidal ideations: Secondary | ICD-10-CM

## 2019-02-02 DIAGNOSIS — Z008 Encounter for other general examination: Secondary | ICD-10-CM

## 2019-02-02 DIAGNOSIS — F17210 Nicotine dependence, cigarettes, uncomplicated: Secondary | ICD-10-CM | POA: Insufficient documentation

## 2019-02-02 LAB — CBC
HCT: 41.1 % (ref 39.0–52.0)
Hemoglobin: 12.9 g/dL — ABNORMAL LOW (ref 13.0–17.0)
MCH: 27.9 pg (ref 26.0–34.0)
MCHC: 31.4 g/dL (ref 30.0–36.0)
MCV: 89 fL (ref 80.0–100.0)
NRBC: 0 % (ref 0.0–0.2)
Platelets: 239 10*3/uL (ref 150–400)
RBC: 4.62 MIL/uL (ref 4.22–5.81)
RDW: 16.8 % — ABNORMAL HIGH (ref 11.5–15.5)
WBC: 6.7 10*3/uL (ref 4.0–10.5)

## 2019-02-02 LAB — COMPREHENSIVE METABOLIC PANEL
ALT: 19 U/L (ref 0–44)
AST: 18 U/L (ref 15–41)
Albumin: 3.5 g/dL (ref 3.5–5.0)
Alkaline Phosphatase: 52 U/L (ref 38–126)
Anion gap: 7 (ref 5–15)
BUN: 16 mg/dL (ref 8–23)
CO2: 25 mmol/L (ref 22–32)
Calcium: 8.9 mg/dL (ref 8.9–10.3)
Chloride: 107 mmol/L (ref 98–111)
Creatinine, Ser: 1.25 mg/dL — ABNORMAL HIGH (ref 0.61–1.24)
GFR calc Af Amer: >60 mL/min (ref >60)
GFR calc non Af Amer: 57 mL/min — ABNORMAL LOW (ref >60)
Glucose, Bld: 97 mg/dL (ref 70–99)
POTASSIUM: 3.4 mmol/L — AB (ref 3.5–5.1)
Sodium: 139 mmol/L (ref 135–145)
Total Bilirubin: 0.4 mg/dL (ref 0.3–1.2)
Total Protein: 6.6 g/dL (ref 6.5–8.1)

## 2019-02-02 LAB — ACETAMINOPHEN LEVEL: Acetaminophen (Tylenol), Serum: <10 ug/mL — ABNORMAL LOW (ref 10–30)

## 2019-02-02 LAB — ETHANOL: Alcohol, Ethyl (B): <10 mg/dL (ref <10)

## 2019-02-02 LAB — SALICYLATE LEVEL: Salicylate Lvl: <7 mg/dL (ref 2.8–30.0)

## 2019-02-02 NOTE — ED Provider Notes (Signed)
[facility]   CSN: [id number] Arrival date & time: 02/02/19  1920    History   Chief Complaint Chief Complaint  Patient presents with  . Suicidal    HPI Christopher Short. is a 73 y.o. male.     HPI    The patient is here for evaluation of ongoing problems including suicidal ideation, without a plan, use of, cocaine, and being homeless.  He was discharged from the ED earlier today after being seen and evaluated for similar problems.  He was observed overnight, considered for placement at [facility], but ultimately denied placement there.  He apparently has some VA benefits, but there is reported to be problems with them.  Patient was seen by peer support and given resources, at the time of discharge.  He states he is feeling the same, and return here because he was no better.  Yesterday the patient refused to go to a shelter "because there is drugs there."  Patient denies other symptoms or problems at this time.  There are no other known modifying factors.  Past Medical History:  Diagnosis Date  . PTSD (post-traumatic stress disorder)     Patient Active Problem List   Diagnosis Date Noted  . Cocaine abuse with cocaine-induced mood disorder (HCC) 02/01/2019  . Tobacco use disorder 01/06/2019  . Alcohol use disorder, moderate, dependence (HCC) 01/06/2019  . Severe recurrent major depressive disorder with psychotic features (HCC) 01/05/2019  . Malingering 08/13/2017  . Cocaine use disorder, moderate, dependence (HCC) 07/01/2016  . PTSD (post-traumatic stress disorder) 07/01/2016    History reviewed. No pertinent surgical history.      Home Medications    Prior to Admission medications   Medication Sig Start Date End Date Taking? Authorizing Provider  albuterol (PROVENTIL HFA;VENTOLIN HFA) 108 (90 Base) MCG/ACT inhaler Inhale 2 puffs into the lungs every 6 (six) hours as needed for wheezing or shortness of breath.   Yes  [provider]  Cholecalciferol (VITAMIN D) 50 MCG (2000 UT) tablet Take 2,000 Units by mouth daily.   Yes [provider]  Melatonin 3 MG TABS Take 6 mg by mouth at bedtime.   Yes [provider]  Multiple Vitamin (MULTIVITAMIN WITH MINERALS) TABS tablet Take 1 tablet by mouth daily.   Yes [provider]  QUEtiapine (SEROQUEL) 300 MG tablet Take 600 mg by mouth at bedtime.   Yes [provider]  traZODone (DESYREL) 150 MG tablet Take 300 mg by mouth at bedtime.   Yes [provider]    Family History History reviewed. No pertinent family history.  Social History Social History   Tobacco Use  . Smoking status: Current Every Day Smoker    Packs/day: 1.00    Types: Cigarettes  . Smokeless tobacco: Never Used  Substance Use Topics  . Alcohol use: Yes    Frequency: Never    Comment: last drank 01-03-19 16 ox x 1 beer   . Drug use: Never     Allergies   Patient has no known allergies.   Review of Systems Review of Systems  All other systems reviewed and are negative.    Physical Exam Updated Vital Signs BP 115/86 (BP Location: Right Arm)   Pulse 100   Temp (!) 97.2 F (36.2 C) (Oral)   Resp 18   Ht 6\' 4"  (1.93 m)   Wt 104 kg   SpO2 98%   BMI 27.91 kg/m   Physical Exam Vitals signs  and nursing note reviewed.  Constitutional:      General: He is not in acute distress.    Appearance: He is well-developed and normal weight. He is not ill-appearing or diaphoretic.     Comments: Disheveled  HENT:     Head: Normocephalic and atraumatic.     Right Ear: External ear normal.     Left Ear: External ear normal.  Eyes:     Conjunctiva/sclera: Conjunctivae normal.     Pupils: Pupils are equal, round, and reactive to light.  Neck:     Musculoskeletal: Normal range of motion and neck supple.     Trachea: Phonation normal.  Cardiovascular:     Rate and Rhythm: Normal rate.  Pulmonary:     Effort: Pulmonary effort is  normal.  Abdominal:     Palpations: Abdomen is soft.  Musculoskeletal: Normal range of motion.  Skin:    General: Skin is warm and dry.  Neurological:     Mental Status: He is alert and oriented to person, place, and time.     Cranial Nerves: No cranial nerve deficit.     Sensory: No sensory deficit.     Motor: No abnormal muscle tone.     Coordination: Coordination normal.     Comments: No dysarthria, or aphasia  Psychiatric:        Mood and Affect: Mood normal.        Behavior: Behavior normal.      ED Treatments / Results  Labs (all labs ordered are listed, but only abnormal results are displayed) Labs Reviewed  COMPREHENSIVE METABOLIC PANEL - Abnormal; Notable for the following components:      Result Value   Potassium 3.4 (*)    Creatinine, Ser 1.25 (*)    GFR calc non Af Amer 57 (*)    All other components within normal limits  ACETAMINOPHEN LEVEL - Abnormal; Notable for the following components:   Acetaminophen (Tylenol), Serum <10 (*)    All other components within normal limits  CBC - Abnormal; Notable for the following components:   Hemoglobin 12.9 (*)    RDW 16.8 (*)    All other components within normal limits  ETHANOL  SALICYLATE LEVEL  RAPID URINE DRUG SCREEN, HOSP PERFORMED    EKG None  Radiology No results found.  Procedures Procedures (including critical care time)  Medications Ordered in ED Medications - No data to display   Initial Impression / Assessment and Plan / ED Course  I have reviewed the triage vital signs and the nursing notes.  Pertinent labs & imaging results that were available during my care of the patient were reviewed by me and considered in my medical decision making (see chart for details).         Patient Vitals for the past 24 hrs:  BP Temp Temp src Pulse Resp SpO2 Height Weight  02/02/19 1952 - - - - - - 6\' 4"  (1.93 m) 104 kg  02/02/19 1950 115/86 (!) 97.2 F (36.2 C) Oral 100 18 98 % - -    11:45 PM  Reevaluation with update and discussion. After initial assessment and treatment, an updated evaluation reveals no change in clinical status. Mancel Bale   Medical Decision Making: Vague suicidal ideation without plan.  Homelessness with cocaine abuse.  Patient has been evaluated, within the last 24 hours, seen by multiple providers, including peer support and given appropriate resources.  Currently, there is no indication for hospitalization or inpatient psychiatric treatment at this  time.  CRITICAL CARE-no Performed by: Mancel BaleElliott Lynee Rosenbach  Nursing Notes Reviewed/ Care Coordinated Applicable Imaging Reviewed Interpretation of Laboratory Data incorporated into ED treatment  The patient appears reasonably screened and/or stabilized for discharge and I doubt any other medical condition or other [facility] requiring further screening, evaluation, or treatment in the ED at this time prior to discharge.  Plan: Home Medications-continue usual; Home Treatments-rest, fluids, avoid cocaine; return here if the recommended treatment, does not improve the symptoms; Recommended follow up-follow-up for treatment as an outpatient, avoid use of cocaine    Final Clinical Impressions(s) / ED Diagnoses   Final diagnoses:  Cocaine abuse (HCC)  Homelessness  Suicidal thoughts    ED Discharge Orders    None       Mancel BaleWentz, Kile Kabler, MD 02/02/19 2349

## 2019-02-02 NOTE — Discharge Instructions (Signed)
To help you maintain a sober lifestyle, a substance abuse treatment program may be beneficial to you.  Contact one of the following facilities at your earliest opportunity to ask about enrolling:  RESIDENTIAL PROGRAMS:       [facility]      [address]      [place], [place] [address]      [phone]  OUTPATIENT PROGRAMS:       [facility]      [address], [place] [address]      [phone]      New patients are seen at the walk-in clinic every Tuesday from 9:00 am - 12:00 pm       [facility]      [address]      [place], [place] [address]      [phone]  If you currently have VA benefits, the VA system also has treatment options available:  INPATIENT/RESIDENTIAL:       [facility]      [address]      [facility]      [phone]  OUTPATIENT:       [facility]      [address] [place], [place] [address]      [phone]

## 2019-02-02 NOTE — ED Notes (Addendum)
Patient is speaking with Peer Review and not ready for discharge. Patient is calm and cooperative.

## 2019-02-02 NOTE — Discharge Instructions (Addendum)
Avoid using cocaine.  Follow-up for treatment at 1 of the facilities recommended earlier today.  We are supplying the discharge instructions, again, which were given to you earlier.  Take all of your medications as prescribed.  Follow-up with your primary care doctor for checkup in 1 or 2 weeks.

## 2019-02-02 NOTE — BH Assessment (Signed)
[facility] Assessment Progress Note  Per Juanetta Beets, DO, this pt does not require psychiatric hospitalization at this time.  Pt is to be discharged from [facility] with substance abuse treatment information.  This has been included in pt's discharge instructions.  Pt's nurse has been notified.  Doylene Canning, MA Triage Specialist [phone]

## 2019-02-02 NOTE — ED Notes (Addendum)
[employer], VA personal, has called requested to speak with Judie Grieve. He has stepped away. Her number is [phone]: [phone]. Relying the information to [facility] as Clinical research associate types this information.

## 2019-02-02 NOTE — ED Notes (Signed)
Pt A&O x 3, calm & cooperative with sad affect, presents with SI, plan to run in front of car or jump off bridge.  Feeling hopeless.  Denies HI, admits to auditory hallucinations, telling him to kill himself.  Monitoring for safety, Q 15 min checks in effect.

## 2019-02-02 NOTE — ED Triage Notes (Signed)
Pt states he was discharged from here today without feeling any better. States he still feels suicidal, with no plan.

## 2019-02-03 NOTE — ED Notes (Signed)
Pt cleared for discharge home,  A&O x 3, gait steady, no distress noted,  Discharge instructions given.

## 2019-06-23 IMAGING — CT CT CHEST W/O CM
2 of 3 series · 15 of 36 positions shown, 18 images · non-contrast
Comparison: None.

CLINICAL DATA: Lung nodule

EXAM:
CT CHEST WITHOUT CONTRAST
TECHNIQUE: Multidetector CT imaging of the chest was performed following the
standard protocol without IV contrast.

[Series 3: chest w/o 2mm st · axial · non-contrast · 0.88mm/px · z∈[+1046,+1338]mm · 12 of 172 slices shown, 15 images]
[im 13/172  mediastinal]
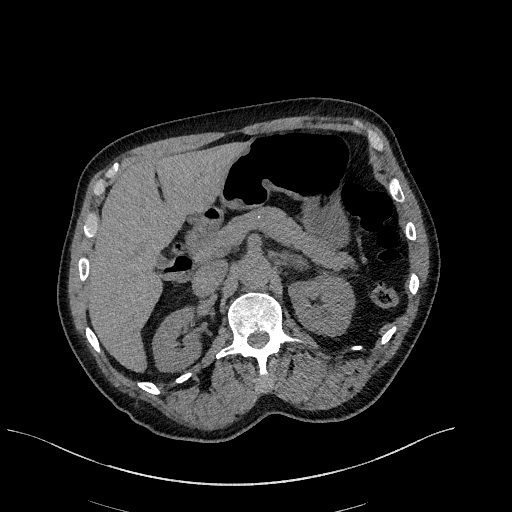
[im 13/172  lung]
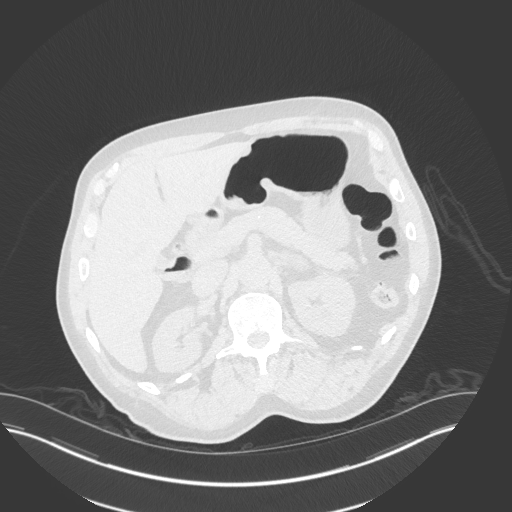
[im 26/172  lung]
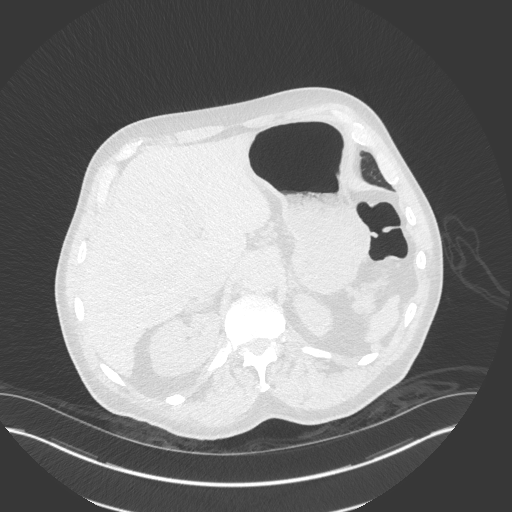
[im 39/172  lung]
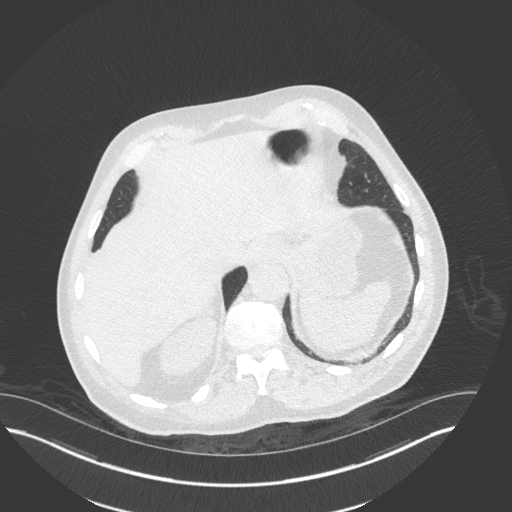
[im 51/172  lung]
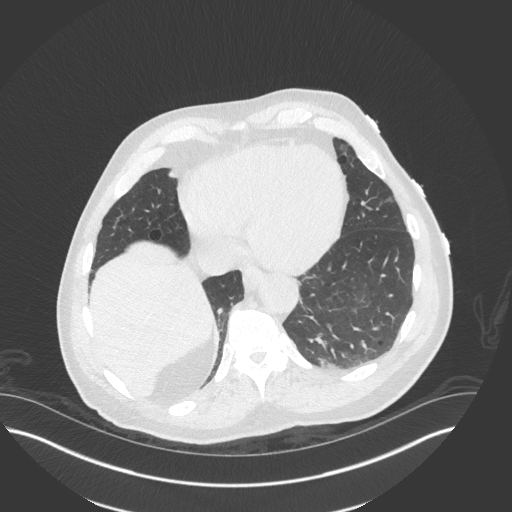
[im 64/172  mediastinal]
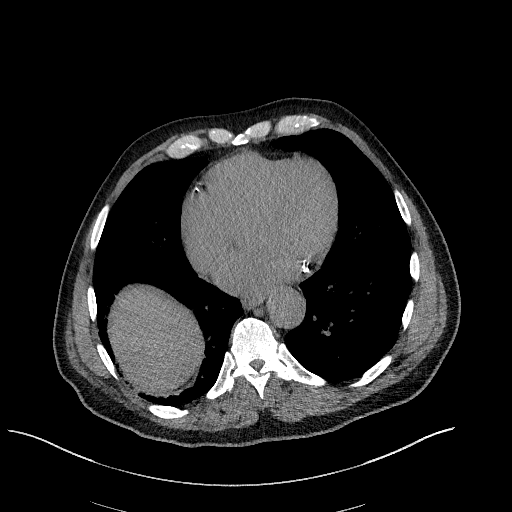
[im 64/172  lung]
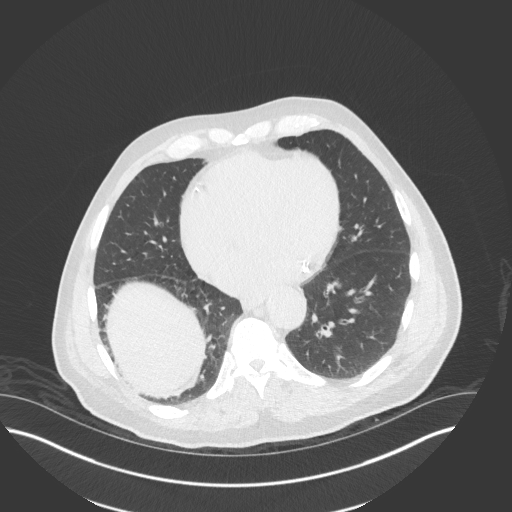
[im 77/172  lung]
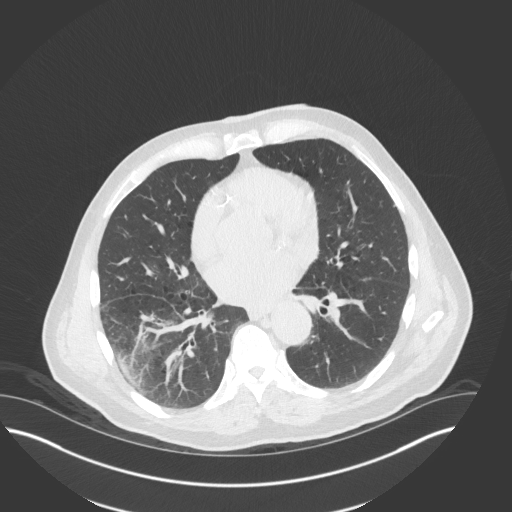
[im 96/172  lung]
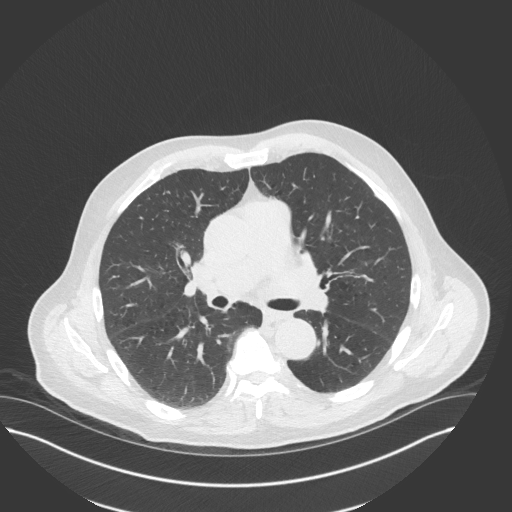
[im 108/172  lung]
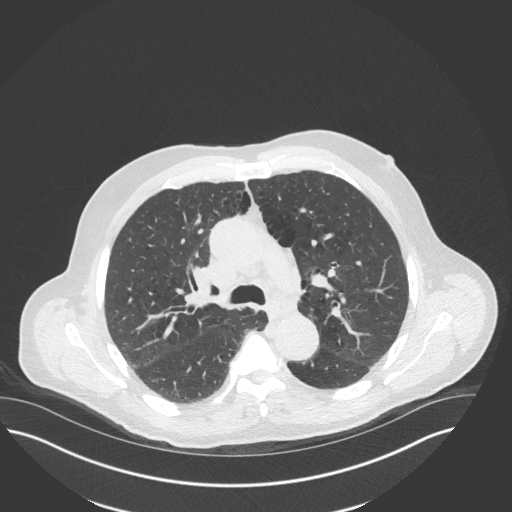
[im 121/172  mediastinal]
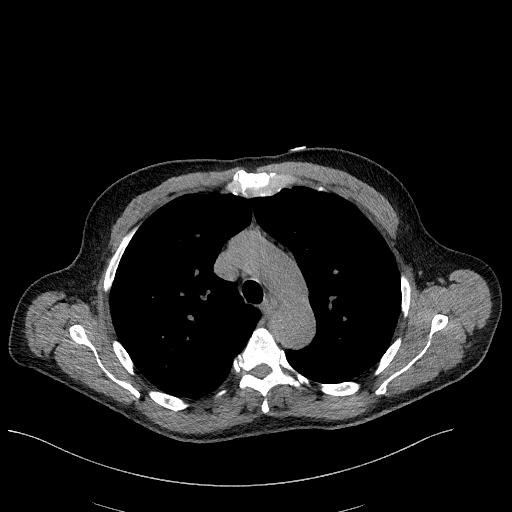
[im 121/172  lung]
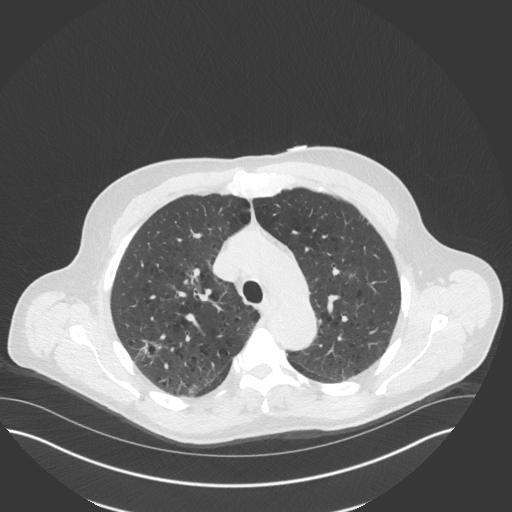
[im 134/172  lung]
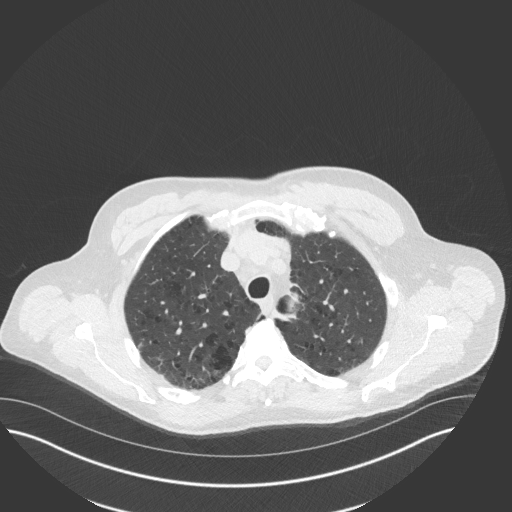
[im 146/172  lung]
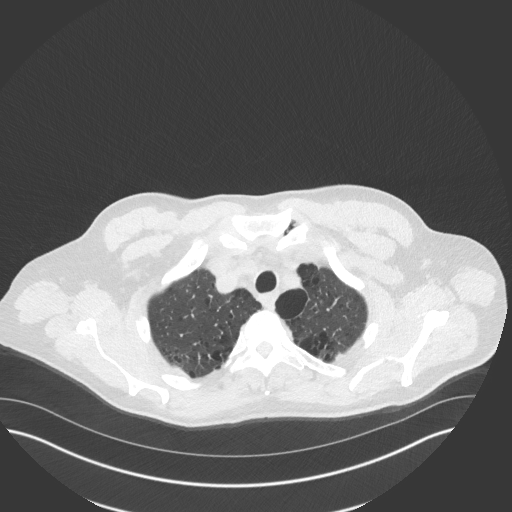
[im 159/172  lung]
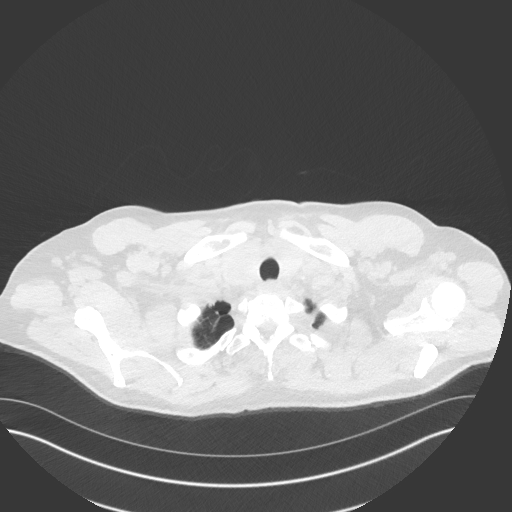

[Series 5: chest w/o 3mm st cor · coronal · non-contrast · 0.66mm/px · 3 of 101 slices shown]
[im 21/101  lung]
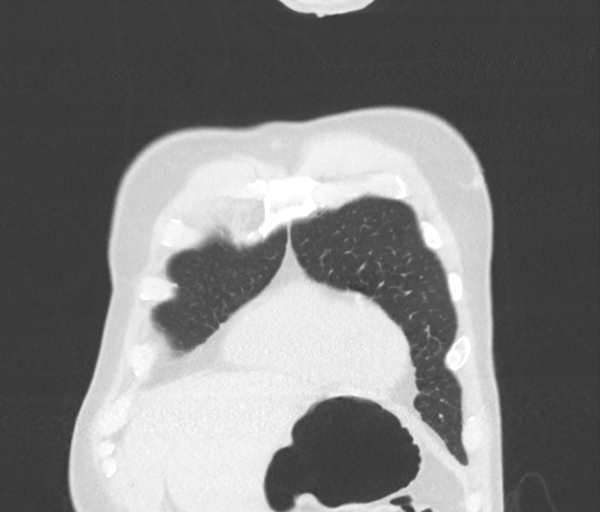
[im 41/101  lung]
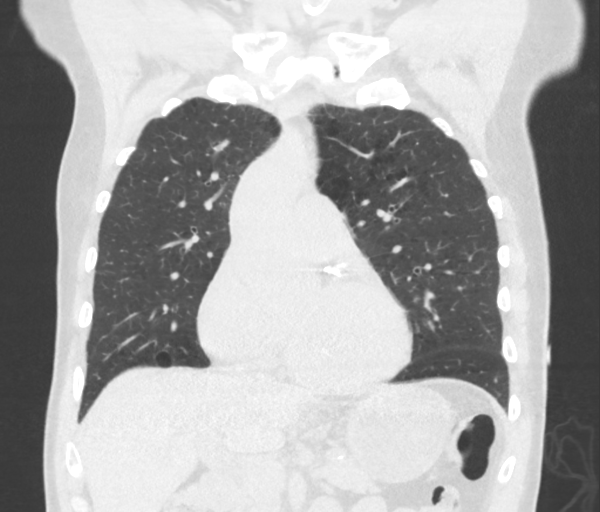
[im 61/101  lung]
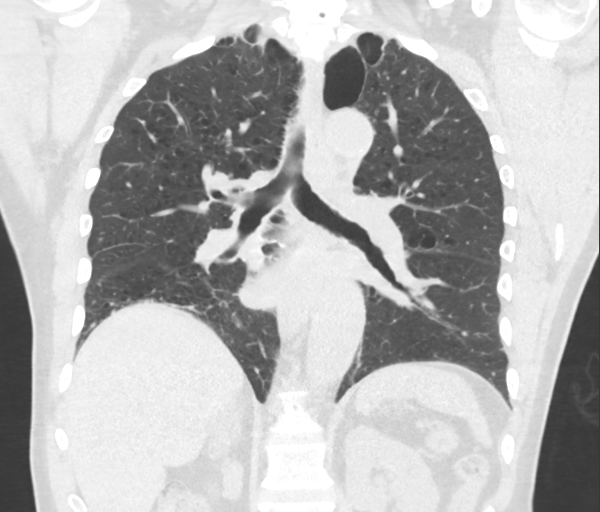

[15 of 36 positions shown; findings below may reference images not displayed]

FINDINGS: Cardiovascular: Heart is upper limits normal in size. Moderate
coronary artery calcifications. Scattered aortic calcifications. No
aneurysm.

Mediastinum/Nodes: Mildly enlarged lymph nodes in the AP window with
index lymph node measuring 15 mm.

Lungs/Pleura: Centrilobular and paraseptal emphysema. Irregular
spiculated nodule in the right upper lobe corresponds to the density
seen on chest x-ray. This measures up to 2.3 cm and could reflect
primary lung cancer. No other suspicious pulmonary nodules. No
confluent opacities or effusions.

Upper Abdomen: Imaging into the upper abdomen shows no acute
findings.

Musculoskeletal: Chest wall soft tissues are unremarkable. No acute
bony abnormality.
IMPRESSION: 2.3 cm spiculated nodule in the right upper lobe concerning for
primary lung cancer. This could be further evaluated with PET CT.

Mediastinal adenopathy, most notable in the AP window region.

Moderate coronary artery calcifications.

Aortic Atherosclerosis (CFUP1-W7R.R) and Emphysema (CFUP1-RP2.F).
